# Patient Record
Sex: Female | Born: 1970 | Race: White | Hispanic: Yes | Marital: Married | Smoking: Current every day smoker
Health system: Southern US, Community
[De-identification: ages and names within clinical notes are randomized; demographics above are authoritative.]

## PROBLEM LIST (undated history)

## (undated) DIAGNOSIS — F32A Depression, unspecified: Secondary | ICD-10-CM

## (undated) DIAGNOSIS — F329 Major depressive disorder, single episode, unspecified: Secondary | ICD-10-CM

## (undated) DIAGNOSIS — G4730 Sleep apnea, unspecified: Secondary | ICD-10-CM

## (undated) DIAGNOSIS — K219 Gastro-esophageal reflux disease without esophagitis: Secondary | ICD-10-CM

## (undated) DIAGNOSIS — R569 Unspecified convulsions: Secondary | ICD-10-CM

## (undated) HISTORY — PX: ABDOMINAL HYSTERECTOMY: SHX81

## (undated) HISTORY — PX: CHOLECYSTECTOMY: SHX55

---

## 1989-12-25 HISTORY — PX: BRAIN TUMOR EXCISION: SHX577

## 2005-01-12 ENCOUNTER — Emergency Department: Payer: Self-pay | Admitting: Internal Medicine

## 2005-03-24 ENCOUNTER — Ambulatory Visit: Payer: Self-pay

## 2005-08-24 ENCOUNTER — Emergency Department: Payer: Self-pay | Admitting: Emergency Medicine

## 2005-10-17 ENCOUNTER — Emergency Department: Payer: Self-pay | Admitting: Emergency Medicine

## 2005-10-25 ENCOUNTER — Encounter: Payer: Self-pay | Admitting: Specialist

## 2006-02-08 ENCOUNTER — Emergency Department: Payer: Self-pay | Admitting: Emergency Medicine

## 2006-02-28 ENCOUNTER — Ambulatory Visit: Payer: Self-pay

## 2006-06-25 ENCOUNTER — Emergency Department: Payer: Self-pay | Admitting: Emergency Medicine

## 2006-07-26 ENCOUNTER — Ambulatory Visit: Payer: Self-pay

## 2006-11-28 ENCOUNTER — Ambulatory Visit: Payer: Self-pay

## 2006-12-10 ENCOUNTER — Emergency Department: Payer: Self-pay | Admitting: Emergency Medicine

## 2007-01-02 ENCOUNTER — Encounter: Payer: Self-pay | Admitting: Orthopedic Surgery

## 2007-01-21 ENCOUNTER — Ambulatory Visit: Payer: Self-pay | Admitting: Orthopedic Surgery

## 2007-01-25 ENCOUNTER — Encounter: Payer: Self-pay | Admitting: Orthopedic Surgery

## 2007-07-29 ENCOUNTER — Emergency Department: Payer: Self-pay | Admitting: Emergency Medicine

## 2007-07-29 ENCOUNTER — Other Ambulatory Visit: Payer: Self-pay

## 2007-08-02 ENCOUNTER — Ambulatory Visit: Payer: Self-pay | Admitting: Emergency Medicine

## 2007-10-18 ENCOUNTER — Ambulatory Visit: Payer: Self-pay

## 2007-10-24 ENCOUNTER — Inpatient Hospital Stay: Payer: Self-pay

## 2007-12-26 HISTORY — PX: CERVICAL FUSION: SHX112

## 2008-05-08 ENCOUNTER — Inpatient Hospital Stay (HOSPITAL_COMMUNITY): Admission: RE | Admit: 2008-05-08 | Discharge: 2008-05-08 | Payer: Self-pay | Admitting: Neurosurgery

## 2009-12-14 ENCOUNTER — Encounter: Admission: RE | Admit: 2009-12-14 | Discharge: 2009-12-14 | Payer: Self-pay | Admitting: Neurosurgery

## 2010-01-17 ENCOUNTER — Ambulatory Visit (HOSPITAL_COMMUNITY): Admission: RE | Admit: 2010-01-17 | Discharge: 2010-01-18 | Payer: Self-pay | Admitting: Neurosurgery

## 2010-06-01 ENCOUNTER — Encounter: Admission: RE | Admit: 2010-06-01 | Discharge: 2010-06-01 | Payer: Self-pay | Admitting: Neurosurgery

## 2010-08-03 ENCOUNTER — Emergency Department: Payer: Self-pay | Admitting: Unknown Physician Specialty

## 2010-08-13 ENCOUNTER — Encounter: Admission: RE | Admit: 2010-08-13 | Discharge: 2010-08-13 | Payer: Self-pay | Admitting: Neurosurgery

## 2010-09-20 ENCOUNTER — Ambulatory Visit: Payer: Self-pay

## 2011-03-12 LAB — CBC
Hemoglobin: 13.5 g/dL (ref 12.0–15.0)
MCV: 100.6 fL — ABNORMAL HIGH (ref 78.0–100.0)
RBC: 3.88 MIL/uL (ref 3.87–5.11)
WBC: 7.7 10*3/uL (ref 4.0–10.5)

## 2011-05-09 NOTE — Op Note (Signed)
Leah Carpenter, Leah Carpenter               ACCOUNT NO.:  [id number]   MEDICAL RECORD NO.:  [id number]          PATIENT TYPE:  OBV   LOCATION:  3534                         FACILITY:  [facility]   PHYSICIAN:  Donalee Citrin, M.D.        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   The patient failed all forms of conservative treatment with anti-  inflammatories, physical therapy, steroids, and electrical stimulation.  The patient's MRI scan showed central disk herniation at C4-C5, C5-C6  with kyphosis, and spinal cord compression.  The patient after failing  several treatments with MRI findings, she was recommended anterior  cervical diskectomy.  Risks and benefits of the operation were explained  to the patient.  She understood and agreed to proceed forward.   PROCEDURE IN DETAIL:  The patient was brought to the OR, was induced  with general anesthesia, placed in supine, neck was flexed in extension  and placed on 5 pounds of Holter traction.  The right side of her neck  was prepped and draped in the usual sterile fashion.  Preop x-ray  localized at the C6 vertebral body and a curvilinear incision was made  just superior to this, just off the midline to the anterior portion of  the sternocleidomastoid.  Superficially, the platysma was dissected out  and divided longitudinally.  The avascular end of the  sternocleidomastoid strap muscle was developed down to the prevertebral  fascia.  The prevertebral fascia was then dissected with Kitners.  Intraoperative x-ray confirmed localization of C5-C6 disk space.  Annulotomy was made with 15-blade scalpel, marked the disk space, and  then the longus colli reflected laterally at both disk space and the one  above it. A  self-retaining retractor was then placed.  Annulotomies were  extended to both levels.  Anterior osteophytes were bitten off the C4  and C5 vertebral bodies and both interspaces were scraped and drilled  down to the posterior annulus and posterior osteophyte complexes.  At  this point, the operative microscope was draped and brought into the  field.  Under microscopic illumination, __________  at C4-C5.  The  incisions were further drilled down, and then using 1-mm and 2-mm  Kerrison punch, the undersurface of the posterior annulus and posterior  longitudinal ligament was removed in piecemeal fashion.  There was a  large central disk herniation immediately expressed and this was removed  with a micropituitary and then the undersurface of the both endplates  were aggressively under bitten, decompressed at the central canal at the  level of the proximal C5 pedicle.  C5 foramen was then explored and  noted to be widely patent, and after the endplates were under bitten to  decompress the central canal, the wound was copiously irrigated and  endplates were scraped and Gelfoam was placed.  Attention was then taken  to C5-C6 in  a similar fashion and C5-C6 disk space was drilled down to  posterior annulus and posterior osteophyte complexes.  Again, central  disk herniation was appreciated at this level as well.  This was all  under bitten, decompressed at the central canal, both C6 pedicles were  identified, both C6 neural foramen were decompressed.  At the end of  diskectomy here again, there was no further stenosis of central canal or  either C6 nerve root.  Endplates were scraped and sized.  A 7-mm graft  was inserted at C5-C6 and 8-mm graft had been opened up for C4-C5;  however, after the placement of 7-mm graft to C5-C6, this closed down  that interspace and it would not accept the 8 anymore and it was not  felt to be in her best interest to over distract the disk space by  putting 8, and  so, the 8 was discarded and 7-mm graft was opened and  there was good apposition of the endplates and it was placed under  compression.  Then after the grafts were in place, a 37.5-mm Venture  plate was placed.  All 6 screws were drilled and then placed.  All  screws had excellent purchase.  Locking mechanism was engaged.  Wound  was  copiously irrigated.  Meticulous hemostasis was maintained.  Then, the  wound was closed in layers with interrupted Vicryl on the platysma and a  running 4-0 subcuticular.  Benzoin and Steri-Strips were applied.  The  patient went to the recovery room in stable condition.  At the end of  the case, instrument count was correct.           ______________________________  Donalee Citrin, M.D.     GC/MEDQ  D:  05/08/2008  T:  05/08/2008  Job:  [id number]

## 2012-04-29 ENCOUNTER — Emergency Department: Payer: Self-pay | Admitting: Emergency Medicine

## 2012-10-11 ENCOUNTER — Emergency Department (HOSPITAL_COMMUNITY)
Admission: EM | Admit: 2012-10-11 | Discharge: 2012-10-12 | Disposition: A | Discharge status: Home / Self Care | Payer: Federal, State, Local not specified - PPO | Attending: Emergency Medicine | Admitting: Emergency Medicine

## 2012-10-11 ENCOUNTER — Emergency Department (HOSPITAL_COMMUNITY): Payer: Federal, State, Local not specified - PPO

## 2012-10-11 ENCOUNTER — Encounter (HOSPITAL_COMMUNITY): Payer: Self-pay | Admitting: Neurology

## 2012-10-11 DIAGNOSIS — F329 Major depressive disorder, single episode, unspecified: Secondary | ICD-10-CM

## 2012-10-11 DIAGNOSIS — W19XXXA Unspecified fall, initial encounter: Secondary | ICD-10-CM | POA: Insufficient documentation

## 2012-10-11 DIAGNOSIS — N390 Urinary tract infection, site not specified: Secondary | ICD-10-CM | POA: Insufficient documentation

## 2012-10-11 DIAGNOSIS — G8929 Other chronic pain: Secondary | ICD-10-CM | POA: Insufficient documentation

## 2012-10-11 DIAGNOSIS — K219 Gastro-esophageal reflux disease without esophagitis: Secondary | ICD-10-CM | POA: Insufficient documentation

## 2012-10-11 DIAGNOSIS — S0003XA Contusion of scalp, initial encounter: Secondary | ICD-10-CM | POA: Insufficient documentation

## 2012-10-11 DIAGNOSIS — S1093XA Contusion of unspecified part of neck, initial encounter: Secondary | ICD-10-CM | POA: Insufficient documentation

## 2012-10-11 DIAGNOSIS — R4182 Altered mental status, unspecified: Secondary | ICD-10-CM

## 2012-10-11 DIAGNOSIS — Z79899 Other long term (current) drug therapy: Secondary | ICD-10-CM | POA: Insufficient documentation

## 2012-10-11 HISTORY — DX: Unspecified convulsions: R56.9

## 2012-10-11 HISTORY — DX: Gastro-esophageal reflux disease without esophagitis: K21.9

## 2012-10-11 HISTORY — DX: Major depressive disorder, single episode, unspecified: F32.9

## 2012-10-11 HISTORY — DX: Depression, unspecified: F32.A

## 2012-10-11 LAB — CBC WITH DIFFERENTIAL/PLATELET
Basophils Absolute: 0.1 10*3/uL (ref 0.0–0.1)
Basophils Relative: 1 % (ref 0–1)
Eosinophils Relative: 5 % (ref 0–5)
HCT: 37.9 % (ref 36.0–46.0)
MCHC: 34.8 g/dL (ref 30.0–36.0)
MCV: 91.1 fL (ref 78.0–100.0)
Monocytes Absolute: 0.7 10*3/uL (ref 0.1–1.0)
Platelets: 257 10*3/uL (ref 150–400)
RDW: 14 % (ref 11.5–15.5)

## 2012-10-11 LAB — RAPID URINE DRUG SCREEN, HOSP PERFORMED
Amphetamines: NOT DETECTED
Barbiturates: NOT DETECTED
Benzodiazepines: NOT DETECTED

## 2012-10-11 LAB — URINALYSIS, ROUTINE W REFLEX MICROSCOPIC
Bilirubin Urine: NEGATIVE
Ketones, ur: NEGATIVE mg/dL
Leukocytes, UA: NEGATIVE
Nitrite: POSITIVE — AB
Protein, ur: NEGATIVE mg/dL
Urobilinogen, UA: 1 mg/dL (ref 0.0–1.0)

## 2012-10-11 LAB — ACETAMINOPHEN LEVEL: Acetaminophen (Tylenol), Serum: <15 ug/mL (ref 10–30)

## 2012-10-11 LAB — COMPREHENSIVE METABOLIC PANEL
ALT: 17 U/L (ref 0–35)
BUN: 10 mg/dL (ref 6–23)
Calcium: 9.1 mg/dL (ref 8.4–10.5)
Chloride: 104 mEq/L (ref 96–112)
GFR calc Af Amer: >90 mL/min (ref >90)
Glucose, Bld: 109 mg/dL — ABNORMAL HIGH (ref 70–99)
Potassium: 3.7 mEq/L (ref 3.5–5.1)
Total Protein: 6.8 g/dL (ref 6.0–8.3)

## 2012-10-11 LAB — URINE MICROSCOPIC-ADD ON

## 2012-10-11 LAB — POCT PREGNANCY, URINE: Preg Test, Ur: NEGATIVE

## 2012-10-11 MED ORDER — NALOXONE HCL 0.4 MG/ML IJ SOLN
INTRAMUSCULAR | Status: AC
  Filled 2012-10-11: qty 1

## 2012-10-11 MED ORDER — SODIUM CHLORIDE 0.9 % IV BOLUS (SEPSIS)
1000.0000 mL | Freq: Once | INTRAVENOUS | Status: AC
  Administered 2012-10-11: 1000 mL via INTRAVENOUS

## 2012-10-11 MED ORDER — LAMOTRIGINE 100 MG PO TABS: 100.0000 mg | ORAL_TABLET | Freq: Two times a day (BID) | ORAL | Status: DC

## 2012-10-11 MED ORDER — METHOCARBAMOL 500 MG PO TABS
500.0000 mg | ORAL_TABLET | Freq: Three times a day (TID) | ORAL | Status: DC
  Administered 2012-10-12 (×3): 500 mg via ORAL
  Filled 2012-10-11 (×4): qty 1

## 2012-10-11 MED ORDER — SODIUM CHLORIDE 0.9 % IV SOLN
INTRAVENOUS | Status: DC
  Administered 2012-10-11: 21:00:00 via INTRAVENOUS

## 2012-10-11 MED ORDER — DULOXETINE HCL 60 MG PO CPEP
60.0000 mg | ORAL_CAPSULE | Freq: Every day | ORAL | Status: DC
  Filled 2012-10-11: qty 1

## 2012-10-11 MED ORDER — OXYCODONE-ACETAMINOPHEN 5-325 MG PO TABS
2.0000 | ORAL_TABLET | Freq: Once | ORAL | Status: AC
  Administered 2012-10-12: 2 via ORAL
  Filled 2012-10-11: qty 2

## 2012-10-11 MED ORDER — GABAPENTIN 300 MG PO CAPS
300.0000 mg | ORAL_CAPSULE | Freq: Three times a day (TID) | ORAL | Status: DC
  Administered 2012-10-12 (×2): 300 mg via ORAL
  Filled 2012-10-11 (×2): qty 1

## 2012-10-11 MED ORDER — PANTOPRAZOLE SODIUM 40 MG PO TBEC
40.0000 mg | DELAYED_RELEASE_TABLET | Freq: Every day | ORAL | Status: DC
  Administered 2012-10-12 (×2): 40 mg via ORAL
  Filled 2012-10-11 (×2): qty 1

## 2012-10-11 MED ORDER — NALOXONE HCL 0.4 MG/ML IJ SOLN
0.4000 mg | Freq: Once | INTRAMUSCULAR | Status: AC
  Administered 2012-10-11: 0.4 mg via INTRAVENOUS

## 2012-10-11 NOTE — ED Notes (Addendum)
Family at [facility] x2, pt began full body shaking, lasting about 20sec, pt then began to speak with crying voice in spanish, family understood what pt was saying, "speaking about her father who died several years ago and had a recent birthday", pt emotional crying, tears noted.  Pt still not responding to peripheral or central pain, not interactive, not following commands, airway intact, resps e/u, VSS, will continue to monitor. EDP updated.

## 2012-10-11 NOTE — ED Notes (Signed)
Pt reoriented to place & events

## 2012-10-11 NOTE — ED Notes (Signed)
EDP at [facility] speaking with family

## 2012-10-11 NOTE — ED Notes (Signed)
Per ems- Pt comes from home. Pt called her son at 11 he reports "she was talking out of her head" . Came over to check on her, found her in the bathroom on the floor. Blood on the floor. Hematoma to right forehead. CBG 96, HR 123/72, HR 85. Pt opening eyes. EDP at bedside.

## 2012-10-11 NOTE — ED Notes (Signed)
[employer] at bedside, pt continues to be combative, pt reoriented to place, Fonnie Jarvis remains at bedside

## 2012-10-11 NOTE — ED Notes (Signed)
Back from CT, no changes.  

## 2012-10-11 NOTE — ED Notes (Signed)
Family updated, VSS, no changes, remains on monitor, IVF infusing.

## 2012-10-11 NOTE — ED Notes (Signed)
Pt remains volatile, aggitated, beligerant, yelling. [employer] & EDP Dr. Fonnie Jarvis present at [facility] CN/AD aware.

## 2012-10-11 NOTE — ED Notes (Signed)
No changes. Pt reviewed with EDP. pending disposition.

## 2012-10-11 NOTE — ED Notes (Addendum)
While EDP inserting nasal trumpet, pt having flailing extremities, atypical seizure like activity. Pt is 100% on 2 L. Trumpet was not placed.

## 2012-10-11 NOTE — ED Notes (Signed)
Heard patient yelling and came into room.  RN Toniann Fail attempting to talk with patient.  Patient yelling at husband, stating "I need to get out of here!  Just let me get out of here!"  Patient pulling at lines and monitor wires.  Patient continues to yell at staff, trying to hit staff.  EDP Bednar informed, Charge RN Barbara Cower informed and at bedside.  Patient continuing to yell, try and pull lines, and hit staff.  Dr. Fonnie Jarvis attempting to talk to patient, orient her to location and situation.  Patient continues to yell and scream, "Get off of me!"

## 2012-10-11 NOTE — ED Notes (Signed)
Pt lethargic, MAEx4, not following commands, reacts to physical stimuli, coughing, VSS, SPo2 100%, resps 22, husband at [facility], family updated, cxr complete, lab at [facility], yellow metal necklace and charms removed given to husband. Pending CTs and urine.

## 2012-10-11 NOTE — ED Notes (Signed)
Pts husband yelled out for help, upon entering the room the pt was very combative, pt attempting to climb out of the bed, pt stating, "Get off of me. I do not know where I am at. Get away from me. Get off of me." pt swinging arms & kicking legs, Eustaquio Boyden, EMT assisted in keeping the pt from falling out of the bed, Barbara Cower, [employer] RN notified & @ bedside, Fonnie Jarvis, MD at bedside, husband at bedside

## 2012-10-11 NOTE — ED Notes (Signed)
No changes. Family x2 at [facility]. VSS. NAD, calm, resting, lethargic, non-interactive, non-verbal, sz precautions & monitor in place.

## 2012-10-11 NOTE — ED Notes (Addendum)
No sz activity at this time. sz pads in place, Buhler in place, to CT. Husband updated.

## 2012-10-11 NOTE — ED Notes (Signed)
Pt husband at bedside

## 2012-10-11 NOTE — ED Provider Notes (Signed)
Pt to CDU while waiting unassigned admission for AMS.    11:17 PM Pt has not been moved to CDU yet.  However on cursory exam, pt is alert and is verbally combative, yelling and requiring 2 person restrained.  Pt able to verbalized in a coherent manner, but forceful.  Vital sign otherwise stable, protecting her airway.    12:14 AM Pt has not been moved to CDU.  Pt will likely be admitted pending Dr. Jonn Shingles reevaluation.    i was not directly involved in the patient care during this visit.    Fayrene Helper, PA-C 10/12/12 0015
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
## 2012-10-11 NOTE — ED Notes (Signed)
EDP, RN x3, security and [employer] at [facility], pt alert & interactive, MAEx4.

## 2012-10-12 MED ORDER — CEPHALEXIN 500 MG PO CAPS: 500.0000 mg | ORAL_CAPSULE | Freq: Two times a day (BID) | ORAL | Status: DC

## 2012-10-12 MED ORDER — LAMOTRIGINE 200 MG PO TABS
200.0000 mg | ORAL_TABLET | Freq: Every day | ORAL | Status: DC
  Administered 2012-10-12: 200 mg via ORAL
  Filled 2012-10-12: qty 1

## 2012-10-12 MED ORDER — IBUPROFEN 400 MG PO TABS
600.0000 mg | ORAL_TABLET | Freq: Once | ORAL | Status: AC
  Administered 2012-10-12: 600 mg via ORAL
  Filled 2012-10-12 (×2): qty 1

## 2012-10-12 MED ORDER — LAMOTRIGINE 100 MG PO TABS
100.0000 mg | ORAL_TABLET | Freq: Every day | ORAL | Status: DC
  Administered 2012-10-12: 100 mg via ORAL
  Filled 2012-10-12 (×2): qty 1

## 2012-10-12 MED ORDER — HYDROXYZINE HCL 25 MG PO TABS: 25.0000 mg | ORAL_TABLET | Freq: Two times a day (BID) | ORAL | Status: DC

## 2012-10-12 MED ORDER — CEPHALEXIN 250 MG PO CAPS
500.0000 mg | ORAL_CAPSULE | Freq: Two times a day (BID) | ORAL | Status: DC
  Administered 2012-10-12: 500 mg via ORAL
  Filled 2012-10-12: qty 2

## 2012-10-12 NOTE — ED Notes (Signed)
Medications located. Pt discharged. Had no further questions.
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
## 2012-10-12 NOTE — ED Notes (Signed)
Sitter has returned 

## 2012-10-12 NOTE — ED Notes (Signed)
Calm, NAD, alert, interactive, family at [facility]. Tolerating PO meds.

## 2012-10-12 NOTE — ED Notes (Signed)
Security called for patient to be wonded.

## 2012-10-12 NOTE — ED Notes (Signed)
Relieved sitter

## 2012-10-12 NOTE — ED Notes (Signed)
Pt and family member states that medications were given to pharmacy tech last night and they did not receive medications back. Charge nurse aware and AD notified.

## 2012-10-12 NOTE — ED Notes (Signed)
Alert, NAD, calm, interactive, polite, cooperative, speaking kindly, skin W&D, resps e/u, speaking in clear complete sentences, denies nausea, tolerating PO fluids, mentions neck pain, ice pack applied, meds ordered, agreeable to plan of care at this point in time. EMT at [facility].

## 2012-10-12 NOTE — ED Notes (Signed)
Patient stating that she will not return to bed at this time.  States that she is in pain and needs to sit in a chair.  Patient informed of the need for her to be lying in the bed at this time for further evaluation.  Patient began to yell out, "I don't need to be here!  I can refuse!  I am of sane mind!  Let me leave!"   EDP Bednar and RN Toniann Fail came into the room at that time, with 2 [employer] officers.  Patient continuing to yell.  EDP Bednar talking with patient, states that she is able to sit in a chair at this time if she can remain cooperative.  Patient continuing to yell, stating that "It is my husband's fault that I am here!"  Patient remains sitting in the chair, talking with EDP Bednar regarding plan of care.

## 2012-10-12 NOTE — ED Notes (Signed)
Patient provided with another ice pack at this time.

## 2012-10-12 NOTE — ED Notes (Signed)
Telepsych consult in progress at the time.

## 2012-10-12 NOTE — ED Provider Notes (Addendum)
Pt now awake/alert, demanding to leave She does not understand why she is in the ER She asked to see the IVC paperwork and I showed her the paperwork that was filed with magistrate Initial physician that saw patient was concerned for her safety She has been here less than 24 hours She still needs to be seen by ACT I do not feel comfortable discharging patient until seen by ACT and may need inpatient admission BP 102/64  Pulse 70  Temp 97.3 F (36.3 C) (Oral)  Resp 18  SpO2 97%   Joya Gaskins, MD 10/12/12 1402  2:57 PM Pt seen by ACT She confirms to them that she is not suicidal Husband feels she is safe at home Patient feels comfortable going home as well with husband Seen by telepsych who recommends start vistaril and d/c home Pt has h/o ?seizures and seen by neurology as outpatient and she will f/u with this Pt is awake/alert, appropriate mental status at this time   Joya Gaskins, MD 10/12/12 1459

## 2012-10-12 NOTE — BHH Counselor (Signed)
Fax confirmation of IVC paperwork to [employer] Paperwork placed in pt's chart.

## 2012-10-12 NOTE — ED Notes (Signed)
Lights dimmed per request.  Patient calm and cooperative at this time.

## 2012-10-12 NOTE — ED Notes (Signed)
Patient upset, wanting to leave.  EDP Bednar at bedside talking with patient.  Patient stating that, "I am fine!  I don't want to be here!"  Patient yelling at husband, "This is all your fault that I am here!  You treat me like shit!  For 25 years I have cared for you, and this is how you repay me!"  Patient states, "I don't want to hurt myself, why would I want to do that?  I love my family.  It is all his fault (husband) that I am the way I am."

## 2012-10-12 NOTE — ED Notes (Signed)
Security in to wand patient at this time 

## 2012-10-12 NOTE — ED Notes (Signed)
Patient c/o feeling dizzy.  Patient assisted back into bed and RN Jon informed.  Seizure pads remain in place with suction setup at the head of the bed.  Patient provided with a pillow and warm blanket per request.

## 2012-10-12 NOTE — ED Notes (Signed)
Patient consumed 100% of happy meal. 
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
## 2012-10-12 NOTE — ED Provider Notes (Signed)
Medical screening examination/treatment/procedure(s) were conducted as a shared visit with non-physician practitioner(s) and myself.  I personally evaluated the patient during the encounter  Hurman Horn, MD 10/12/12 [phone]

## 2012-10-12 NOTE — ED Notes (Signed)
Patient calm and cooperative, sitting in chair in the room.  This tech remains at the bedside.  Patient requesting further information regarding the telepsych order and process.  Patient educated on the process at this time, and status of the order.  Patient states, "So it is just like when you call the MD and say you have the flu and need medicine.  You can be lying and how will they ever know?"  Patient educated on other resources available within the ED for behavioral health patients.  Patient states, "Still, how will they know your not lying?"

## 2012-10-12 NOTE — ED Notes (Signed)
Patient's daughter leaving at this time.  Amanda Leger [phone].

## 2012-10-12 NOTE — ED Notes (Addendum)
Pt resting quietly at the time. Sitter remains at bedside. Vital signs stable. Pt is calm and cooperative. Waiting for bed placement on POD C. Pt is conscious alert and oriented x4. Denies pain.

## 2012-10-12 NOTE — ED Notes (Addendum)
Pt agreeable, amiable, cooperative, meds given, moved to room D33. Family at [facility]. IVC in place, waiting on telepsych.

## 2012-10-12 NOTE — ED Notes (Signed)
Pt unable to move to POD C at the time. Nurse to call back when bed available.

## 2012-10-12 NOTE — ED Notes (Signed)
Patient provided with a sprite to drink and 2 ice packs per request.

## 2012-10-12 NOTE — ED Notes (Addendum)
Pt calmer/calm, sitting in chair, drinking water, speaking with female EMT at [facility], [employer] x2 present outside of room. Pt verbalized not wanting to deal with any males.

## 2012-10-12 NOTE — BH Assessment (Signed)
Assessment Note   Leah Carpenter is an 41 y.o. female. Pt was found unresponsive in bathroom by son and brought [facility] by EMS.  Pt became uncooperative last night, demanded discharge, and was placed on IVC by Dr Fonnie Jarvis.  Pt now calm, husband is also present.  Pt reports she is currently seeing a neurologist for siezures and may have had siezure last night.  Pt reports that she is having some problems with depression that have been going on for several years.  Pt has not sought treatment and has never had any mental health treatment.  Pt has some other medical problems, also reports stress due to marital issues and due to her son recently becoming a father and moving back home with his girlfriend and the child.  Pt denies SI/HI/AV.  Pt's husband does not report any concerns that pt is danger to self or others.  Pt does smoke marijauana, reports she uses one time per month, which causes marital tension.  Pt is asking to be discharged at this time.  Axis I: Depressive Disorder NOS Axis II: Deferred Axis III:  Past Medical History  Diagnosis Date  . Seizures   . Depression   . GERD (gastroesophageal reflux disease)    Axis IV: problems with primary support group Axis V: 51-60 moderate symptoms  Past Medical History:  Past Medical History  Diagnosis Date  . Seizures   . Depression   . GERD (gastroesophageal reflux disease)     No past surgical history on file.  Family History: No family history on file.  Social History:  does not have a smoking history on file. She does not have any smokeless tobacco history on file. Her alcohol and drug histories not on file.  Additional Social History:  Alcohol / Drug Use Pain Medications: Pt denies Prescriptions: Pt denies Over the Counter: Pt denies History of alcohol / drug use?: Yes Negative Consequences of Use: Personal relationships Substance #1 Name of Substance 1: marijuana 1 - Age of First Use: 15 1 - Amount (size/oz): 1 joint 1 -  Frequency: 1x month 1 - Duration: 4 years 1 - Last Use / Amount:  1 month ago, 1 joint  CIWA: CIWA-Ar BP: 102/64 mmHg Pulse Rate: 70  COWS:    Allergies: No Known Allergies  Home Medications:  (Not in a hospital admission)  OB/GYN Status:  No LMP recorded. Patient is not currently having periods (Reason: Other).  General Assessment Data Location of Assessment: [facility] ED ACT Assessment: Yes Living Arrangements: Spouse/significant other;Children;Other (Comment) (grandchild) Can pt return to current living arrangement?: Yes  Education Status Is patient currently in school?: No  Risk to self Suicidal Ideation: No Suicidal Intent: No Is patient at risk for suicide?: No Suicidal Plan?: No Access to Means: No What has been your use of drugs/alcohol within the last 12 months?: current marijuana use Previous Attempts/Gestures: No Intentional Self Injurious Behavior: None Family Suicide History: No Recent stressful life event(s): Conflict (Comment) (marital, problems with son and other relatives) Persecutory voices/beliefs?: No Depression: Yes Depression Symptoms: Despondent;Tearfulness;Fatigue;Guilt;Loss of interest in usual pleasures;Feeling angry/irritable Substance abuse history and/or treatment for substance abuse?: Yes Suicide prevention information given to non-admitted patients: Yes  Risk to Others Homicidal Ideation: No Thoughts of Harm to Others: No Current Homicidal Intent: No Current Homicidal Plan: No Access to Homicidal Means: No History of harm to others?: No Assessment of Violence: None Noted Does patient have access to weapons?: Yes (Comment) (husband's guns in home, locked) Criminal Charges  Pending?: No Does patient have a court date: No  Psychosis Hallucinations: None noted Delusions: None noted  Mental Status Report Appear/Hygiene: Other (Comment) (casual) Eye Contact: Good Motor Activity: Unremarkable Speech: Logical/coherent Level of  Consciousness: Alert Mood: Anxious Affect: Appropriate to circumstance Anxiety Level: Moderate Thought Processes: Coherent;Relevant Judgement: Unimpaired Orientation: Person;Place;Time;Situation Obsessive Compulsive Thoughts/Behaviors: None  Cognitive Functioning Concentration: Normal Memory: Recent Intact;Remote Intact IQ: Average Insight: Fair Impulse Control: Fair Appetite: Poor Weight Loss: 10  Weight Gain: 10  Sleep: Increased Total Hours of Sleep: 12  Vegetative Symptoms: None  ADLScreening [facility] Assessment [facility]) Patient's cognitive ability adequate to safely complete daily activities?: Yes Patient able to express need for assistance with ADLs?: Yes Independently performs ADLs?: Yes (appropriate for developmental age)  Abuse/Neglect [facility]) Physical Abuse: Denies Verbal Abuse: Denies Sexual Abuse: Denies  Prior Inpatient Therapy Prior Inpatient Therapy: No  Prior Outpatient Therapy Prior Outpatient Therapy: No  ADL Screening (condition at time of admission) Patient's cognitive ability adequate to safely complete daily activities?: Yes Patient able to express need for assistance with ADLs?: Yes Independently performs ADLs?: Yes (appropriate for developmental age) Weakness of Legs: None Weakness of Arms/Hands: None  Home Assistive Devices/Equipment Home Assistive Devices/Equipment: None    Abuse/Neglect Assessment (Assessment to be complete while patient is alone) Physical Abuse: Denies Verbal Abuse: Denies Sexual Abuse: Denies Exploitation of patient/patient's resources: Denies Self-Neglect: Denies Values / Beliefs Cultural Requests During Hospitalization: None Spiritual Requests During Hospitalization: None   Advance Directives (For Healthcare) Advance Directive: Patient does not have advance directive;Patient would not like information Nutrition Screen- [facility] Adult/[facility]/AP Patient's home diet: Regular  Additional Information 1:1 In Past 12 Months?:  No CIRT Risk: No Elopement Risk: No Does patient have medical clearance?: Yes     Disposition: Pt was placed under IVC last night when she became upset and asked to be discharged.  Pt was evaluated by telepsych who recommended discharge.  ACT discussed this pt with Dr Bebe Shaggy of [facility].  Pt does not meet criteria for commitment so the IVC will be rescinded and pt will be discharged.  Info given for follow up care with family Dr or psychiatrist for depression. Disposition Disposition of Patient: Other dispositions Other disposition(s): Information only  On Site Evaluation by:   Reviewed with Physician:     Lorri Frederick 10/12/2012 3:19 PM

## 2013-06-16 ENCOUNTER — Ambulatory Visit: Payer: Self-pay | Admitting: Diagnostic Neuroimaging

## 2014-01-29 ENCOUNTER — Observation Stay: Payer: Self-pay | Admitting: Surgery

## 2014-01-29 LAB — COMPREHENSIVE METABOLIC PANEL
ALBUMIN: 3.6 g/dL (ref 3.4–5.0)
ALT: 57 U/L (ref 12–78)
ANION GAP: 5 — AB (ref 7–16)
AST: 36 U/L (ref 15–37)
Alkaline Phosphatase: 160 U/L — ABNORMAL HIGH
BILIRUBIN TOTAL: 0.3 mg/dL (ref 0.2–1.0)
BUN: 8 mg/dL (ref 7–18)
CALCIUM: 9.3 mg/dL (ref 8.5–10.1)
CHLORIDE: 102 mmol/L (ref 98–107)
Co2: 28 mmol/L (ref 21–32)
Creatinine: 0.78 mg/dL (ref 0.60–1.30)
EGFR (Non-African Amer.): >60
GLUCOSE: 97 mg/dL (ref 65–99)
Osmolality: 268 (ref 275–301)
POTASSIUM: 4.2 mmol/L (ref 3.5–5.1)
SODIUM: 135 mmol/L — AB (ref 136–145)
Total Protein: 7.7 g/dL (ref 6.4–8.2)

## 2014-01-29 LAB — CBC WITH DIFFERENTIAL/PLATELET
BASOS PCT: 1.3 %
Basophil #: 0.1 10*3/uL (ref 0.0–0.1)
EOS PCT: 3.6 %
Eosinophil #: 0.3 10*3/uL (ref 0.0–0.7)
HCT: 42.2 % (ref 35.0–47.0)
HGB: 14.3 g/dL (ref 12.0–16.0)
LYMPHS ABS: 2.6 10*3/uL (ref 1.0–3.6)
LYMPHS PCT: 28.8 %
MCH: 31.4 pg (ref 26.0–34.0)
MCHC: 33.9 g/dL (ref 32.0–36.0)
MCV: 93 fL (ref 80–100)
Monocyte #: 0.6 x10 3/mm (ref 0.2–0.9)
Monocyte %: 6.3 %
NEUTROS PCT: 60 %
Neutrophil #: 5.5 10*3/uL (ref 1.4–6.5)
Platelet: 318 10*3/uL (ref 150–440)
RBC: 4.55 10*6/uL (ref 3.80–5.20)
RDW: 13.7 % (ref 11.5–14.5)
WBC: 9.2 10*3/uL (ref 3.6–11.0)

## 2014-01-29 LAB — URINALYSIS, COMPLETE
BILIRUBIN, UR: NEGATIVE
GLUCOSE, UR: NEGATIVE mg/dL (ref 0–75)
Ketone: NEGATIVE
LEUKOCYTE ESTERASE: NEGATIVE
NITRITE: NEGATIVE
Ph: 6 (ref 4.5–8.0)
Protein: 30
RBC,UR: 23 /HPF (ref 0–5)
Specific Gravity: 1.018 (ref 1.003–1.030)

## 2014-01-29 LAB — LIPASE, BLOOD: Lipase: 108 U/L (ref 73–393)

## 2014-01-30 LAB — HEPATIC FUNCTION PANEL A (ARMC)
ALBUMIN: 3 g/dL — AB (ref 3.4–5.0)
ALK PHOS: 189 U/L — AB
Bilirubin,Total: 0.4 mg/dL (ref 0.2–1.0)
SGOT(AST): 100 U/L — ABNORMAL HIGH (ref 15–37)
SGPT (ALT): 88 U/L — ABNORMAL HIGH (ref 12–78)
Total Protein: 6.8 g/dL (ref 6.4–8.2)

## 2014-01-30 LAB — CBC WITH DIFFERENTIAL/PLATELET
BASOS PCT: 1 %
Basophil #: 0.1 10*3/uL (ref 0.0–0.1)
EOS ABS: 0.5 10*3/uL (ref 0.0–0.7)
EOS PCT: 6.3 %
HCT: 38.2 % (ref 35.0–47.0)
HGB: 13.5 g/dL (ref 12.0–16.0)
LYMPHS ABS: 3.4 10*3/uL (ref 1.0–3.6)
Lymphocyte %: 44.9 %
MCH: 33 pg (ref 26.0–34.0)
MCHC: 35.4 g/dL (ref 32.0–36.0)
MCV: 93 fL (ref 80–100)
MONO ABS: 0.7 x10 3/mm (ref 0.2–0.9)
Monocyte %: 9.1 %
NEUTROS ABS: 2.9 10*3/uL (ref 1.4–6.5)
NEUTROS PCT: 38.7 %
PLATELETS: 271 10*3/uL (ref 150–440)
RBC: 4.09 10*6/uL (ref 3.80–5.20)
RDW: 13.9 % (ref 11.5–14.5)
WBC: 7.5 10*3/uL (ref 3.6–11.0)

## 2014-01-30 LAB — BASIC METABOLIC PANEL
ANION GAP: 3 — AB (ref 7–16)
BUN: 9 mg/dL (ref 7–18)
CALCIUM: 8.5 mg/dL (ref 8.5–10.1)
CO2: 30 mmol/L (ref 21–32)
CREATININE: 0.93 mg/dL (ref 0.60–1.30)
Chloride: 100 mmol/L (ref 98–107)
EGFR (African American): >60
EGFR (Non-African Amer.): >60
Glucose: 94 mg/dL (ref 65–99)
OSMOLALITY: 265 (ref 275–301)
POTASSIUM: 3.9 mmol/L (ref 3.5–5.1)
Sodium: 133 mmol/L — ABNORMAL LOW (ref 136–145)

## 2014-01-30 LAB — PROTIME-INR
INR: 0.9
PROTHROMBIN TIME: 12 s (ref 11.5–14.7)

## 2014-01-30 LAB — APTT: ACTIVATED PTT: 32.5 s (ref 23.6–35.9)

## 2014-01-31 LAB — CANCER ANTIGEN 19-9: CA 19 9: 6 U/mL (ref 0–35)

## 2014-01-31 LAB — CEA: CEA: 1.4 ng/mL (ref 0.0–4.7)

## 2014-02-02 LAB — PATHOLOGY REPORT

## 2014-06-08 ENCOUNTER — Inpatient Hospital Stay: Payer: Self-pay | Admitting: Internal Medicine

## 2014-06-08 LAB — COMPREHENSIVE METABOLIC PANEL
ALBUMIN: 3.6 g/dL (ref 3.4–5.0)
ANION GAP: 3 — AB (ref 7–16)
AST: 19 U/L (ref 15–37)
Alkaline Phosphatase: 89 U/L
BUN: 10 mg/dL (ref 7–18)
Bilirubin,Total: 0.4 mg/dL (ref 0.2–1.0)
CO2: 31 mmol/L (ref 21–32)
CREATININE: 0.88 mg/dL (ref 0.60–1.30)
Calcium, Total: 9.4 mg/dL (ref 8.5–10.1)
Chloride: 102 mmol/L (ref 98–107)
EGFR (African American): >60
EGFR (Non-African Amer.): >60
GLUCOSE: 100 mg/dL — AB (ref 65–99)
OSMOLALITY: 271 (ref 275–301)
POTASSIUM: 3.7 mmol/L (ref 3.5–5.1)
SGPT (ALT): 17 U/L (ref 12–78)
SODIUM: 136 mmol/L (ref 136–145)
Total Protein: 8.3 g/dL — ABNORMAL HIGH (ref 6.4–8.2)

## 2014-06-08 LAB — URINALYSIS, COMPLETE
Bacteria: NONE SEEN
Glucose,UR: NEGATIVE mg/dL (ref 0–75)
LEUKOCYTE ESTERASE: NEGATIVE
NITRITE: NEGATIVE
Ph: 5 (ref 4.5–8.0)
Protein: 100
RBC,UR: 97 /HPF (ref 0–5)
SPECIFIC GRAVITY: 1.036 (ref 1.003–1.030)
WBC UR: 2 /HPF (ref 0–5)

## 2014-06-08 LAB — CBC
HCT: 43 % (ref 35.0–47.0)
HGB: 14.2 g/dL (ref 12.0–16.0)
MCH: 31.1 pg (ref 26.0–34.0)
MCHC: 33.1 g/dL (ref 32.0–36.0)
MCV: 94 fL (ref 80–100)
PLATELETS: 331 10*3/uL (ref 150–440)
RBC: 4.57 10*6/uL (ref 3.80–5.20)
RDW: 13.7 % (ref 11.5–14.5)
WBC: 17.4 10*3/uL — AB (ref 3.6–11.0)

## 2014-06-10 LAB — CBC WITH DIFFERENTIAL/PLATELET
Basophil #: 0.1 10*3/uL (ref 0.0–0.1)
Basophil %: 0.6 %
EOS ABS: 0.3 10*3/uL (ref 0.0–0.7)
Eosinophil %: 2.7 %
HCT: 32.9 % — ABNORMAL LOW (ref 35.0–47.0)
HGB: 11.1 g/dL — ABNORMAL LOW (ref 12.0–16.0)
Lymphocyte #: 2.8 10*3/uL (ref 1.0–3.6)
Lymphocyte %: 23.8 %
MCH: 31.6 pg (ref 26.0–34.0)
MCHC: 33.8 g/dL (ref 32.0–36.0)
MCV: 93 fL (ref 80–100)
MONO ABS: 0.8 x10 3/mm (ref 0.2–0.9)
Monocyte %: 6.6 %
NEUTROS ABS: 7.8 10*3/uL — AB (ref 1.4–6.5)
NEUTROS PCT: 66.3 %
PLATELETS: 227 10*3/uL (ref 150–440)
RBC: 3.52 10*6/uL — AB (ref 3.80–5.20)
RDW: 13.4 % (ref 11.5–14.5)
WBC: 11.8 10*3/uL — AB (ref 3.6–11.0)

## 2014-06-10 LAB — BASIC METABOLIC PANEL
ANION GAP: 8 (ref 7–16)
BUN: 4 mg/dL — AB (ref 7–18)
CALCIUM: 8.2 mg/dL — AB (ref 8.5–10.1)
Chloride: 104 mmol/L (ref 98–107)
Co2: 27 mmol/L (ref 21–32)
Creatinine: 0.69 mg/dL (ref 0.60–1.30)
EGFR (African American): >60
Glucose: 78 mg/dL (ref 65–99)
Osmolality: 273 (ref 275–301)
POTASSIUM: 3.4 mmol/L — AB (ref 3.5–5.1)
Sodium: 139 mmol/L (ref 136–145)

## 2014-06-10 LAB — MAGNESIUM: Magnesium: 1.6 mg/dL — ABNORMAL LOW

## 2014-06-11 LAB — CBC WITH DIFFERENTIAL/PLATELET
BASOS PCT: 0.6 %
Basophil #: 0.1 10*3/uL (ref 0.0–0.1)
EOS PCT: 2.8 %
Eosinophil #: 0.3 10*3/uL (ref 0.0–0.7)
HCT: 32.4 % — ABNORMAL LOW (ref 35.0–47.0)
HGB: 11.1 g/dL — AB (ref 12.0–16.0)
Lymphocyte #: 2.6 10*3/uL (ref 1.0–3.6)
Lymphocyte %: 28.4 %
MCH: 31.7 pg (ref 26.0–34.0)
MCHC: 34.1 g/dL (ref 32.0–36.0)
MCV: 93 fL (ref 80–100)
MONO ABS: 0.6 x10 3/mm (ref 0.2–0.9)
MONOS PCT: 6.4 %
NEUTROS PCT: 61.8 %
Neutrophil #: 5.6 10*3/uL (ref 1.4–6.5)
Platelet: 231 10*3/uL (ref 150–440)
RBC: 3.49 10*6/uL — AB (ref 3.80–5.20)
RDW: 13.6 % (ref 11.5–14.5)
WBC: 9.1 10*3/uL (ref 3.6–11.0)

## 2014-06-13 LAB — CULTURE, BLOOD (SINGLE)

## 2014-06-24 ENCOUNTER — Ambulatory Visit: Payer: Self-pay | Admitting: Nurse Practitioner

## 2015-04-17 NOTE — H&P (Signed)
PATIENT NAME:  Leah Carpenter, Leah Carpenter MR#:  [id number] DATE OF BIRTH:  [birth year]  DATE OF ADMISSION:  01/29/2014  PRIMARY CARE PHYSICIAN: Dr. Yves DillNeelam Khan of [facility]  ADMITTING PHYSICIAN: Dr. Michela PitcherEly  CHIEF COMPLAINT: Abdominal pain.   BRIEF HISTORY: Ms. Leah Carpenter is a [age] year old woman seen in the Emergency Room with an 8-day history of significant right upper quadrant abdominal pain. She has never had any previous symptoms. The pain came on fairly insidiously over a week ago with primarily right upper quadrant and right back pain. Some of the pain will radiate to her right shoulder. She is mildly anorexic, has had several episodes of nausea, but is currently not nauseated. She has not vomited. She has increased pain when she tries to eat, and has been avoiding food for some time. She is having trouble voiding. She has not had a regular bowel movement in several days. She denies any fever. She has had no previous similar symptoms with her GI tract. She has had no previous workup of her GI tract. She denies a history of hepatitis, yellow jaundice, pancreatitis, peptic ulcer disease, previous diagnosis of gallbladder disease, or diverticulitis. The only previous surgery in her abdomen was hysteroscopy and eventual hysterectomy.   She has history of a brain tumor, felt to be a meningioma, 23 years ago. Surgery performed in the military. She has history of rotator cuff surgery. She has no cardiac disease, hypertension, or diabetes. She has no thyroid problems. She is a cigarette smoker. Does not drink alcohol regularly. She does not work outside her home. She was seen in the Emergency Room with her sister and brother-in-law.   ADMISSION MEDICATIONS: Include amitriptyline 2 tablets at bedtime, ibuprofen 800 mg 3 times daily p.r.n., Lamictal 100 mg 2 times a day, methocarbamol 750 mg 3 times a day, omeprazole 40 mg twice a day, Percocet 7.5/325 every 6 to 8 hours p.r.n.   ALLERGIES: SULFA, PHENERGAN, and  PENICILLIN.   PAST MEDICAL HISTORY:  She has history of spinal fusion and chronic back pain.   PHYSICAL EXAMINATION: GENERAL: She is lying comfortably in bed complaining of right upper quadrant pain.  VITAL SIGNS: Stable. Blood pressure of 103/68, heart rate of 92 and regular. She is afebrile.  HEENT: Exam reveals no scleral icterus. She has poor dentition. She has no facial deformities.  NECK: Supple, nontender, with a midline trachea. No adenopathy.  CHEST: Clear, with no adventitious sounds, and she has normal pulmonary excursion.  CARDIAC: Exam reveals no murmurs or gallops to my ear, and she seems to be in normal sinus rhythm.  ABDOMEN: Reveals some significant right upper quadrant tenderness. I cannot palpate her gallbladder. She has marked guarding, but no rebound. She has some CVA tenderness on the right. She has active bowel sounds, with no hernias noted.  EXTREMITIES:  She has full range of motion in the lower extremities. Good distal pulses. No deformities.  PSYCHIATRIC: Normal orientation, normal affect.   DATA:  Laboratory values were unremarkable, except for some elevated alkaline phosphatase. Ultrasound revealed some mild gallbladder wall thickening, several stones including one probable impacted cystic duct stone. She does not have any significant pericholecystic fluid. She did have a 15 to 20 mm mass in her gallbladder wall, suspicious for a polyp or neoplastic lesion. The Surgical service was consulted.   IMPRESSION: This woman likely has cholecystitis. In this situation, we would anticipate that her gallbladder wall lesion is likely a benign polyp, although the possibility of malignancy cannot be  ruled out completely. We would recommend admission to the hospital, placement on IV antibiotics, and rehydration. We will discuss surgical options with her in detail. I anticipate that she would be a good candidate for laparoscopic surgery, and if her pathology appears to be malignant,  then she would need more aggressive surgical intervention. The family is present during the interview. Will plan to get her admitted this evening and make a decision about surgery in the near future. I will discuss this case with the oncoming surgeon, Dr. Natale Lay.    ____________________________ Quentin Ore III, MD rle:mr D: 01/29/2014 18:46:00 ET T: 01/29/2014 18:59:44 ET JOB#: [id number]  cc: Carmie End, MD, <Dictator> Margaretann Loveless, MD  Quentin Ore MD ELECTRONICALLY SIGNED 02/02/2014 22:56

## 2015-04-17 NOTE — Discharge Summary (Signed)
PATIENT NAME:  Leah Carpenter, Leah Carpenter DATE OF BIRTH:  [birth year]  DATE OF ADMISSION:  06/08/2014 DATE OF DISCHARGE:  06/11/2014  ADMITTING PHYSICIAN:  Dr. Randol KernElgergawy  DISCHARGING PHYSICIAN:  Dr. Ellsworth Lennoxejan-Sie   DISCHARGE DIAGNOSIS: Acute diverticulitis.   IMAGING DATA:  CT scan of the abdomen and pelvis revealed diverticulitis without abscess.   PROCEDURE: None.   HOSPITAL COURSE: This lady was admitted through the Emergency Room with abdominal pain. Imaging in the ED revealed acute diverticulitis. The patient was admitted to a medical bed, initially placed on ciprofloxacin to which we added Flagyl. The patient'Leah Carpenter initial leukocytosis gradually returned to normal.  Her abdominal pain also improved significantly and she was able to tolerate a full regular diet without any discomfort. The patient'Leah Carpenter hospital stay was otherwise uncomplicated. She is being discharged to home in satisfactory condition.   DISCHARGE MEDICATIONS:  1. Flagyl 500 mg every 8 hours, 3 times a day.  2. Ciprofloxacin 500 mg twice a day.  Patient to resume other home medications. Please refer to medical reconciliation for that.   DISCHARGE INSTRUCTIONS:    DIET: Regular, low residue diet.   ACTIVITY: As tolerated.   FOLLOWUP: Follow up in 1-2 weeks with her primary care physician, Dr. Yves DillNeelam Khan.   DISCHARGE PROCESS TIME SPENT: 35 minutes.     ____________________________ Silas FloodSheikh A. Ellsworth Lennoxejan-Sie, MD sat:dd D: 06/28/2014 11:27:15 ET T: 06/28/2014 19:55:38 ET JOB#: [id number]  cc: Marland McalpineSheikh A. Ellsworth Lennoxejan-Sie, MD, <Dictator> Charlesetta GaribaldiSHEIKH A TEJAN-SIE MD ELECTRONICALLY SIGNED 07/03/2014 13:38

## 2015-04-17 NOTE — Discharge Summary (Signed)
PATIENT NAME:  Leah Carpenter, [name] MR#:  [id number] DATE OF BIRTH:  January 13, 1971  DATE OF ADMISSION:  01/29/2014 DATE OF DISCHARGE:  01/31/2014   PRINCIPLE DIAGNOSIS: Acute cholecystitis.   OTHER DIAGNOSES:  1. History of brain tumor (meningioma) 23 years ago.  2. Status post rotator cuff surgery.  3. Status post spinal fusion for chronic back pain.  4. Depression.   PRINCIPAL PROCEDURE PERFORMED DURING THIS ADMISSION: Laparoscopic cholecystectomy.   HOSPITAL COURSE: The patient was admitted to the hospital, given IV fluids and IV antibiotics and taken to the operating room, where she underwent the above-mentioned procedure for the above-mentioned diagnosis. On postoperative day 1, she was doing well, and her pain was controlled with Tylox, and she was complaining of a yeast infection from the antimicrobials. Therefore, she was discharged home with Tylox and 7-day course of 100 mg fluconazole by mouth. She was asked to make an appointment to see me in 1 to 2 weeks and to call in the interim for any recurrent right upper quadrant pain that was worse than what she was experiencing at the time of discharge, or jaundice symptoms or fever.   ____________________________ Claude MangesWilliam F. Maritza Goldsborough, MD wfm:lb D: 01/31/2014 08:15:59 ET T: 01/31/2014 11:42:03 ET JOB#: [id number]  cc: Claude MangesWilliam F. Ladanian Kelter, MD, <Dictator> Claude MangesWILLIAM F Lisset Ketchem MD ELECTRONICALLY SIGNED 02/13/2014 0:11

## 2015-04-17 NOTE — H&P (Signed)
PATIENT NAME:  Leah Carpenter, [place] E MR#:  [id number] DATE OF BIRTH:  04/14/1971  DATE OF ADMISSION:  06/08/2014  REFERRING PHYSICIAN: Dr. Governor Rooksebecca Lord    PRIMARY CARE PHYSICIAN: Dr. Yves DillNeelam Khan   CHIEF COMPLAINT: Abdominal pain.   HISTORY OF PRESENT ILLNESS: This is a 44 year old female who presents with complaints of abdominal pain, reports her pain has been going on over the last two weeks, but has been much worse over the last week, which prompted her to come to the ED today. In the ED, the patient was found to have leukocytosis at 17,000. She got CT abdomen and pelvis with contrast, which did show evidence of acute diverticulitis along the descending colon. The patient reports she had another episode of diverticulitis before, 10 years ago, where she was treated as an outpatient. The patient was recently hospitalized in February of this year due to acute cholecystitis, status post cholecystectomy. The patient was started on IV levofloxacin in the ED, and hospitalist requested to admit the patient for IV antibiotic administration. The patient reports she had nausea. She has multiple episodes of vomiting earlier during the day but none since. The patient denies any chest pain, any diarrhea, any constipation, any fever or chills.   PAST MEDICAL HISTORY: None.   PAST SURGICAL HISTORY:  1.  Cholecystectomy. 2.  Hysterectomy.    ALLERGIES: No known drug allergies.   HOME MEDICATIONS: 1.  Nucynta 50 mg oral 2 tablets every six hours.  2.  Neurontin 600 mg oral 3 times a day.  3.  Skelaxin 800 mg oral 3 times a day.  4.  Omeprazole 40 mg 2 times a day.    SOCIAL HISTORY: The patient lives at home. She smokes on a daily basis. No alcohol. No illicit drug use.   FAMILY HISTORY: Denies any family history of coronary artery disease at young age.   REVIEW OF SYSTEMS:  CONSTITUTIONAL:  Denies fever, chills. Reports fatigue, weakness.  EYES: Denies blurry vision, double vision, inflammation.  ENT:  Denies tinnitus, ear pain, hearing loss. RESPIRATORY: Denies cough, wheezing, hemoptysis.  CARDIOVASCULAR: Denies chest pain, orthopnea, edema.  GASTROINTESTINAL: Reports nausea, mild vomiting and abdominal pain. Denies diarrhea, hematemesis, constipation.  GENITOURINARY: Denies dysuria, hematuria, renal colic.  ENDOCRINE: Denies polyuria, polydipsia, heat or cold intolerance.  HEMATOLOGY: Denies anemia, easy bruising, bleeding diathesis.  INTEGUMENT: Denies acne, rash or skin lesion.  MUSCULOSKELETAL: Denies any swelling, gout, cramps.  NEUROLOGIC: Denies CVA, transient ischemic attack, tremors, ataxia.  PSYCHIATRIC: Denies anxiety, insomnia, or depression.   PHYSICAL EXAMINATION: VITAL SIGNS: Temperature 98.3, pulse 83, respiratory rate 20, blood pressure 145/91, saturating 97% on room air.  GENERAL: Well-nourished female, looks comfortable, in no apparent distress.  HEENT: Head atraumatic, normocephalic. Pupils equal, reactive to light. Pink conjunctivae. Anicteric sclerae. Moist oral mucosa.  NECK: Supple. No thyromegaly. No JVD.  CHEST: Good air entry bilaterally. No wheezing, rales, rhonchi.  CARDIOVASCULAR: S1, S2 heard. No rubs, murmurs, gallops.  ABDOMEN: Mild tenderness to palpation in the left lower quadrant. Otherwise, no rebound, no guarding. Bowel sounds present in four quadrants.  EXTREMITIES: No edema. No clubbing. No cyanosis. Pedal and radial pulses felt bilaterally.  PSYCHIATRIC: Appropriate affect. Awake, alert x3. Intact judgment and insight.  NEUROLOGIC: Cranial nerves grossly intact. Motor 5/5. No focal deficit.  MUSCULOSKELETAL: No joint effusion or erythema.   PERTINENT LABORATORY DATA: Glucose 100, BUN 10, creatinine 0.88, sodium 136, potassium 3.7, chloride 102. White blood cells 17.4, hemoglobin 14.2, hematocrit 43, platelets 331. Urinalysis negative  for leukocyte esterase and nitrite. CT abdomen and pelvis showing acute diverticulitis along the descending colon.    ASSESSMENT AND PLAN: 1.  Acute diverticulitis, as evident on CT abdomen and pelvis, with patient complaints of left lower quadrant abdominal pain. The patient will be started on IV levofloxacin. We will keep her on a clear liquid diet. Will keep him on as-needed nausea and pain medicine.  2.  Tobacco abuse. The patient will be started on nicotine patch.  3.  Deep vein thrombosis prophylaxis. Subcutaneous heparin.   CODE STATUS: Full code.   TOTAL TIME SPENT ON ADMISSION AND PATIENT CARE: 45 minutes.    ____________________________ Starleen Arms, MD dse:cg D: 06/09/2014 01:16:56 ET T: 06/09/2014 02:43:44 ET JOB#: [id number]  cc: Starleen Arms, MD, <Dictator> DAWOOD Teena Irani MD ELECTRONICALLY SIGNED 06/09/2014 6:45

## 2015-04-17 NOTE — Op Note (Signed)
PATIENT NAME:  Leah Carpenter, [name] MR#:  [id number] DATE OF BIRTH:  January 27, 1971  DATE OF PROCEDURE:  01/30/2014  OPERATION PERFORMED: Laparoscopic cholecystectomy.   PREOPERATIVE DIAGNOSIS: Acute cholecystitis and gallbladder mass.   POSTOPERATIVE DIAGNOSIS: Acute cholecystitis (no gallbladder mass).   SURGEON: Claude MangesWilliam F Goerge Mohr, M.D.   ANESTHESIA: General.   PROCEDURE IN DETAIL: The patient was placed supine on the operating room table and prepped and draped in the usual sterile fashion. A 15 mmHg CO2 pneumoperitoneum was created via an infraumbilical Veress needle and this was replaced with a 5 mm trocar and a 30 degree angled laparoscope. Remaining trocars were placed under direct visualization. The gallbladder was decompressed as it was very tense and thick-walled. The very tip of the fundus was then grasped and retracted superiorly and adhesions to the visceral surface of the gallbladder were taken down with electrocautery until the infundibulum was reached, and this was retracted laterally opening up the triangle of Calot. There was significant amount of inflammatory tissue within the triangle of Calot but careful dissection revealed a cystic duct which was doubly clipped and divided and a cystic artery which was doubly clipped and divided. A posterior cystic artery was subsequently identified and was triply clipped and divided and the gallbladder was removed from the liver bed with the electrocautery, taking care not to grab the middle of the visceral surface of the gallbladder as I thought that was where the intraluminal gallbladder mass was located. In doing so, about 2 sq cm of the very tip of the fundus of the gallbladder on the liver bed was left intact on the gallbladder fossa. In other words, the gallbladder was entered and a small portion of the posterior wall was left on the liver. The gallbladder was replaced into an Endo Catch bag and extracted from the abdomen via the epigastric port.  This port site fascia required significant enlargement due to the size of the stones and the thickness of the gallbladder wall and furthermore, I did not want to destroy the visceral surface of the gallbladder where the supposedly mass was located. After I removed it, I looked at the gallbladder and inspected it ex vivo and I cut down through the liver bed toward the infundibulum further and looked at the entire mucosa and there was in fact, no mass present of any kind. There were two large gallstones, however. Therefore, I felt comfortable leaving the small portion of the back wall of the gallbladder on the liver bed as I had done and I closed the fascial defect extracorporeally with a running 0 PDS suture and then reinsufflated the abdomen and reinspected the area. First, I put an additional single 0 Vicryl stitch in the epigastric fascia as it was leaking a little bit of air and then I irrigated the right upper quadrant with copious amounts of warm normal saline and this was all suctioned clear. The area of remaining gallbladder was hemostatic as was the liver bed and the areas of dissection and there was no evidence of bile staining. The clips were secure on their individual structures. Therefore, I desufflated and decannulated the abdomen and closed all 4 skin sites with subcuticular 5-0 Monocryl and suture strips. The patient tolerated the procedure well and there were no complications.   ____________________________ Claude MangesWilliam F. Fernando Stoiber, MD wfm:dp D: 01/30/2014 14:04:39 ET T: 01/30/2014 14:51:23 ET JOB#: [id number]  cc: Claude MangesWilliam F. Carrell Rahmani, MD, <Dictator> Claude MangesWILLIAM F Aliena Ghrist MD ELECTRONICALLY SIGNED 01/31/2014 8:25

## 2017-06-03 ENCOUNTER — Emergency Department: Payer: No Typology Code available for payment source

## 2017-06-03 ENCOUNTER — Encounter: Payer: Self-pay | Admitting: Emergency Medicine

## 2017-06-03 ENCOUNTER — Emergency Department
Admission: EM | Admit: 2017-06-03 | Discharge: 2017-06-03 | Disposition: A | Discharge status: Home / Self Care | Payer: No Typology Code available for payment source | Attending: Emergency Medicine | Admitting: Emergency Medicine

## 2017-06-03 DIAGNOSIS — Z9049 Acquired absence of other specified parts of digestive tract: Secondary | ICD-10-CM | POA: Diagnosis not present

## 2017-06-03 DIAGNOSIS — F17290 Nicotine dependence, other tobacco product, uncomplicated: Secondary | ICD-10-CM | POA: Diagnosis not present

## 2017-06-03 DIAGNOSIS — H9313 Tinnitus, bilateral: Secondary | ICD-10-CM | POA: Diagnosis not present

## 2017-06-03 DIAGNOSIS — Z7982 Long term (current) use of aspirin: Secondary | ICD-10-CM | POA: Diagnosis not present

## 2017-06-03 DIAGNOSIS — M549 Dorsalgia, unspecified: Secondary | ICD-10-CM | POA: Diagnosis not present

## 2017-06-03 DIAGNOSIS — Y9389 Activity, other specified: Secondary | ICD-10-CM | POA: Insufficient documentation

## 2017-06-03 DIAGNOSIS — Y92410 Unspecified street and highway as the place of occurrence of the external cause: Secondary | ICD-10-CM | POA: Diagnosis not present

## 2017-06-03 DIAGNOSIS — T700XXA Otitic barotrauma, initial encounter: Secondary | ICD-10-CM | POA: Insufficient documentation

## 2017-06-03 DIAGNOSIS — Z79899 Other long term (current) drug therapy: Secondary | ICD-10-CM | POA: Diagnosis not present

## 2017-06-03 DIAGNOSIS — Y998 Other external cause status: Secondary | ICD-10-CM | POA: Diagnosis not present

## 2017-06-03 DIAGNOSIS — M542 Cervicalgia: Secondary | ICD-10-CM | POA: Diagnosis present

## 2017-06-03 MED ORDER — CYCLOBENZAPRINE HCL 5 MG PO TABS: 5.0000 mg | ORAL_TABLET | Freq: Three times a day (TID) | ORAL | 0 refills | Status: AC | PRN

## 2017-06-03 MED ORDER — ORPHENADRINE CITRATE 30 MG/ML IJ SOLN
60.0000 mg | Freq: Two times a day (BID) | INTRAMUSCULAR | Status: DC
  Administered 2017-06-03: 60 mg via INTRAMUSCULAR
  Filled 2017-06-03: qty 2

## 2017-06-03 MED ORDER — MELOXICAM 7.5 MG PO TABS: 7.5000 mg | ORAL_TABLET | Freq: Every day | ORAL | 1 refills | Status: AC
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
## 2018-06-19 ENCOUNTER — Other Ambulatory Visit: Payer: Self-pay | Admitting: Urology

## 2018-06-19 DIAGNOSIS — R310 Gross hematuria: Secondary | ICD-10-CM

## 2018-06-25 ENCOUNTER — Ambulatory Visit
Admission: RE | Admit: 2018-06-25 | Discharge: 2018-06-25 | Disposition: A | Discharge status: Home / Self Care | Payer: Federal, State, Local not specified - PPO | Source: Ambulatory Visit | Attending: Urology | Admitting: Urology

## 2018-06-25 DIAGNOSIS — R310 Gross hematuria: Secondary | ICD-10-CM | POA: Diagnosis present

## 2018-06-25 DIAGNOSIS — K5730 Diverticulosis of large intestine without perforation or abscess without bleeding: Secondary | ICD-10-CM | POA: Insufficient documentation

## 2018-06-25 MED ORDER — IOPAMIDOL (ISOVUE-300) INJECTION 61%
125.0000 mL | Freq: Once | INTRAVENOUS | Status: AC | PRN
  Administered 2018-06-25: 125 mL via INTRAVENOUS

## 2018-10-29 ENCOUNTER — Encounter: Payer: Self-pay | Admitting: General Surgery

## 2018-10-29 ENCOUNTER — Other Ambulatory Visit: Payer: Self-pay

## 2018-10-29 ENCOUNTER — Ambulatory Visit: Payer: Federal, State, Local not specified - PPO | Admitting: General Surgery

## 2018-10-29 VITALS — BP 115/79 | HR 66 | Temp 97.2°F | Ht 63.0 in | Wt 159.0 lb

## 2018-10-29 DIAGNOSIS — K5732 Diverticulitis of large intestine without perforation or abscess without bleeding: Secondary | ICD-10-CM

## 2018-10-29 DIAGNOSIS — R1084 Generalized abdominal pain: Secondary | ICD-10-CM | POA: Diagnosis not present

## 2018-10-29 DIAGNOSIS — K219 Gastro-esophageal reflux disease without esophagitis: Secondary | ICD-10-CM | POA: Diagnosis not present

## 2018-10-29 NOTE — Patient Instructions (Addendum)
Patient to have a ct colonoscopy and upper endoscopy. The patient is aware to call back for any questions or concerns.  The patient is scheduled for an Upper Endoscopy on 11/06/18 at [facility]. She is aware to call the day prior for her arrival time. The patient is aware of date and instructions.  Leah Carpenter will contact the patient to schedule her virtual colonoscopy.

## 2018-10-29 NOTE — Progress Notes (Signed)
Patient ID: Leah Carpenter, female   DOB: 01/07/1971, 47 y.o.   MRN: [id number]  Chief Complaint  Patient presents with  . Other    HPI Leah Carpenter is a 47 y.o. female here today for a evaluation of a diverticulitis and chronic diarrhea .Lots of reflux. She reports that she has 5-6 stools per day for the last several months, marked change from her baseline of 2-3 stools per day.  She denies blood or mucus in the bowel movement.   She had a colonoscopy done on 08/29/2018 by Dr. Mechele Collin that was incomplete due to a suspected stricture at the proximal sigmoid colon.   Patient had a CT scan done on 06/25/2018 for hematuria.   Patient states she has lost 20 pounds over the last years.   The patient reports a marked decrease in cough and dyspnea since discontinuing smoking, substituting e-cigarettes.  She may still be experiencing reflux due to the nicotine depression of lower esophageal sphincter pressure.   HPI  Past Medical History:  Diagnosis Date  . Depression   . GERD (gastroesophageal reflux disease)   . Seizures (HCC)     Past Surgical History:  Procedure Laterality Date  . ABDOMINAL HYSTERECTOMY Left    one ovary remaining  . BRAIN TUMOR EXCISION  1991  . CERVICAL FUSION  2009  . CHOLECYSTECTOMY      Family History  Problem Relation Age of Onset  . Bone cancer Sister   . Uterine cancer Paternal Grandmother   . Stomach cancer Cousin        5 cousin  . Breast cancer Maternal Aunt        3 aunt  . Ovarian cancer Sister     Social History Social History   Tobacco Use  . Smoking status: Current Every Day Smoker    Types: E-cigarettes  . Smokeless tobacco: Never Used  Substance Use Topics  . Alcohol use: No  . Drug use: No    No Known Allergies  Current Outpatient Medications  Medication Sig Dispense Refill  . omeprazole (PRILOSEC) 40 MG capsule Take 40 mg by mouth 2 (two) times daily.    . ranitidine (ZANTAC) 150 MG tablet Take 150 mg by mouth 2 (two)  times daily.     No current facility-administered medications for this visit.     Review of Systems Review of Systems  Constitutional: Negative.   Respiratory: Negative.   Cardiovascular: Negative.   Gastrointestinal: Positive for diarrhea.    Blood pressure 115/79, pulse 66, temperature (!) 97.2 F (36.2 C), temperature source Skin, height 5\' 3"  (1.6 m), weight 159 lb (72.1 kg).  Physical Exam Physical Exam  Constitutional: She is oriented to person, place, and time. She appears well-developed and well-nourished.  Eyes: Conjunctivae are normal. No scleral icterus.  Neck: Normal range of motion.  Cardiovascular: Normal rate, regular rhythm and normal heart sounds.  Pulmonary/Chest: Effort normal and breath sounds normal.  Abdominal: Soft. Bowel sounds are normal. There is tenderness ( left lower quadrant).  Lymphadenopathy:    She has no cervical adenopathy.  Neurological: She is alert and oriented to person, place, and time.  Skin: Skin is warm and dry.    Data Reviewed Colonoscopy dated August 29, 2018 completed by Lynnae Prude, MD at [facility] was reviewed.  A benign appearing, in [place], severe stenosis was found in the proximal sigmoid colon and was unable to be traversed.  Internal hemorrhoids.  Otherwise unremarkable exam.  CT scan of the  abdomen and pelvis obtained for evaluation of microhematuria of June 25, 2018 was reviewed.  No indication for hematuria source identified.  "Age advanced diffuse diverticulosis".  This extended well into the right colon with the most extensive disease in the rectosigmoid.  No clear evidence of stenosis appreciated on this study completed without oral contrast.  CT scan of the abdomen and pelvis dated June 08, 2014 was reviewed.  Acute diverticulitis noted in the descending colon with soft tissue inflammation, free fluid and associated colonic wall thickening.  No evidence of perforation.  Assessment    Diverticulosis with at least  one CT confirmed episode of diverticulitis in 2015.  Incomplete colonoscopy.  Significant history for upper abdominal reflux.    Plan  Prior to consideration of elective colectomy I recommended 1) an upper endoscopy to be completed by Dr. Mechele Collin or myself to assess her reflux symptoms, especially in light of her past history of smoking and 2) a CT colonoscopy to evaluate the proximal colon for possible polyps.  After the studies are completed we will review options for management of the diverticulosis.  The patient may have only retrograde obstruction and no antegrade obstruction, although her history of diarrhea with water slightly less frequent and pudding-like stools may represent partial obstruction.  The patient reports weight loss over the last couple of years without particular any change in her diet.  The July 2019 CT did not show any source for unexplained weight loss.  Patient to have a ct colonoscopy in [place] and upper endoscopy at [facility].  The patient is aware to call back for any questions or concerns.   HPI, Physical Exam, Assessment and Plan have been scribed under the direction and in the presence of Donnalee Curry, MD.  [name], CMA  I have completed the exam and reviewed the above documentation for accuracy and completeness.  I agree with the above.  Museum/gallery conservator has been used and any errors in dictation or transcription are unintentional.  Donnalee Curry, M.D., F.A.C.S.  The patient is scheduled for an Upper Endoscopy on 11/06/18 at [facility]. She is aware to call the day prior for her arrival time. The patient is aware of date and instructions. Center imaging will contact the patient to schedule her virtual colonoscopy. Documented by Caryl-Lyn Louanna Raw LPN  Merrily Pew [name] 10/30/2018, 3:58 PM

## 2018-10-30 DIAGNOSIS — K219 Gastro-esophageal reflux disease without esophagitis: Secondary | ICD-10-CM | POA: Insufficient documentation

## 2018-10-30 DIAGNOSIS — R1084 Generalized abdominal pain: Secondary | ICD-10-CM | POA: Insufficient documentation

## 2018-10-30 DIAGNOSIS — K5732 Diverticulitis of large intestine without perforation or abscess without bleeding: Secondary | ICD-10-CM | POA: Insufficient documentation

## 2018-11-06 ENCOUNTER — Ambulatory Visit: Payer: Federal, State, Local not specified - PPO | Admitting: Anesthesiology

## 2018-11-06 ENCOUNTER — Ambulatory Visit
Admission: RE | Admit: 2018-11-06 | Discharge: 2018-11-06 | Disposition: A | Discharge status: Home / Self Care | Payer: Federal, State, Local not specified - PPO | Source: Ambulatory Visit | Attending: General Surgery | Admitting: General Surgery

## 2018-11-06 ENCOUNTER — Encounter
Admission: RE | Disposition: A | Discharge status: Home / Self Care | Payer: Self-pay | Source: Ambulatory Visit | Attending: General Surgery

## 2018-11-06 ENCOUNTER — Encounter: Payer: Self-pay | Admitting: *Deleted

## 2018-11-06 DIAGNOSIS — K2950 Unspecified chronic gastritis without bleeding: Secondary | ICD-10-CM | POA: Insufficient documentation

## 2018-11-06 DIAGNOSIS — K317 Polyp of stomach and duodenum: Secondary | ICD-10-CM | POA: Insufficient documentation

## 2018-11-06 DIAGNOSIS — K3189 Other diseases of stomach and duodenum: Secondary | ICD-10-CM | POA: Insufficient documentation

## 2018-11-06 DIAGNOSIS — F17290 Nicotine dependence, other tobacco product, uncomplicated: Secondary | ICD-10-CM | POA: Diagnosis not present

## 2018-11-06 DIAGNOSIS — K219 Gastro-esophageal reflux disease without esophagitis: Secondary | ICD-10-CM

## 2018-11-06 DIAGNOSIS — K449 Diaphragmatic hernia without obstruction or gangrene: Secondary | ICD-10-CM | POA: Insufficient documentation

## 2018-11-06 DIAGNOSIS — K529 Noninfective gastroenteritis and colitis, unspecified: Secondary | ICD-10-CM | POA: Diagnosis not present

## 2018-11-06 DIAGNOSIS — K210 Gastro-esophageal reflux disease with esophagitis: Secondary | ICD-10-CM | POA: Diagnosis not present

## 2018-11-06 HISTORY — PX: ESOPHAGOGASTRODUODENOSCOPY (EGD) WITH PROPOFOL: SHX5813

## 2018-11-06 SURGERY — ESOPHAGOGASTRODUODENOSCOPY (EGD) WITH PROPOFOL
Anesthesia: General

## 2018-11-06 MED ORDER — MIDAZOLAM HCL 2 MG/2ML IJ SOLN
INTRAMUSCULAR | Status: AC
  Filled 2018-11-06: qty 2

## 2018-11-06 MED ORDER — PROPOFOL 10 MG/ML IV BOLUS
INTRAVENOUS | Status: DC | PRN
  Administered 2018-11-06: 80 mg via INTRAVENOUS
  Administered 2018-11-06: 40 mg via INTRAVENOUS

## 2018-11-06 MED ORDER — LIDOCAINE HCL (PF) 2 % IJ SOLN
INTRAMUSCULAR | Status: AC
  Filled 2018-11-06: qty 10

## 2018-11-06 MED ORDER — LIDOCAINE HCL (CARDIAC) PF 100 MG/5ML IV SOSY
PREFILLED_SYRINGE | INTRAVENOUS | Status: DC | PRN
  Administered 2018-11-06 (×2): 40 mg via INTRAVENOUS

## 2018-11-06 MED ORDER — SODIUM CHLORIDE 0.9 % IV SOLN
INTRAVENOUS | Status: DC
  Administered 2018-11-06: 08:00:00 via INTRAVENOUS

## 2018-11-06 MED ORDER — GLYCOPYRROLATE 0.2 MG/ML IJ SOLN
INTRAMUSCULAR | Status: DC | PRN
  Administered 2018-11-06: 0.2 mg via INTRAVENOUS

## 2018-11-06 MED ORDER — MIDAZOLAM HCL 2 MG/2ML IJ SOLN
INTRAMUSCULAR | Status: DC | PRN
  Administered 2018-11-06: 2 mg via INTRAVENOUS

## 2018-11-06 MED ORDER — PROPOFOL 500 MG/50ML IV EMUL
INTRAVENOUS | Status: DC | PRN
  Administered 2018-11-06: 150 ug/kg/min via INTRAVENOUS

## 2018-11-06 MED ORDER — PROPOFOL 500 MG/50ML IV EMUL
INTRAVENOUS | Status: AC
  Filled 2018-11-06: qty 50

## 2018-11-06 NOTE — Anesthesia Post-op Follow-up Note (Signed)
Anesthesia QCDR form completed.        

## 2018-11-06 NOTE — Anesthesia Procedure Notes (Addendum)
Date/Time: 11/06/2018 9:08 AM Performed by: Stormy Fabianurtis, Matraca Hunkins, CRNA Pre-anesthesia Checklist: Patient identified, Emergency Drugs available, Suction available and Patient being monitored Patient Re-evaluated:Patient Re-evaluated prior to induction Oxygen Delivery Method: Nasal cannula Induction Type: IV induction Dental Injury: Teeth and Oropharynx as per pre-operative assessment  Comments: Nasal cannula with etCO2 monitoring

## 2018-11-06 NOTE — H&P (Signed)
No change in clinical history or exam. Hx symptomatic reflux on good medical regimen.  For EGD.

## 2018-11-06 NOTE — Transfer of Care (Signed)
Immediate Anesthesia Transfer of Care Note  Patient: Leah Carpenter  Procedure(s) Performed: Procedure(s): ESOPHAGOGASTRODUODENOSCOPY (EGD) WITH PROPOFOL (N/A)  Patient Location: PACU and Endoscopy Unit  Anesthesia Type:General  Level of Consciousness: sedated  Airway & Oxygen Therapy: Patient Spontanous Breathing and Patient connected to nasal cannula oxygen  Post-op Assessment: Report given to RN and Post -op Vital signs reviewed and stable  Post vital signs: Reviewed and stable  Last Vitals:  Vitals:   11/06/18 0920 11/06/18 0929  BP:  96/61  Pulse:  74  Resp:  18  Temp: (!) (P) 36.2 C   SpO2:  98%    Complications: No apparent anesthesia complications

## 2018-11-06 NOTE — Op Note (Signed)
[facility] Gastroenterology Patient Name: Leah Carpenter Procedure Date: 11/06/2018 9:00 AM MRN: [id number] Account #: [id number] Date of Birth: 18-Mar-1971 Admit Type: Outpatient Age: 47 Room: [facility] ENDO ROOM 1 Gender: Female Note Status: Finalized Procedure:            Upper GI endoscopy Indications:          Indigestion Providers:            Earline Mayotte, MD Medicines:            Monitored Anesthesia Care Complications:        No immediate complications. Procedure:            Pre-Anesthesia Assessment:                       - Prior to the procedure, a History and Physical was                        performed, and patient medications, allergies and                        sensitivities were reviewed. The patient's tolerance of                        previous anesthesia was reviewed.                       - The risks and benefits of the procedure and the                        sedation options and risks were discussed with the                        patient. All questions were answered and informed                        consent was obtained.                       After obtaining informed consent, the endoscope was                        passed under direct vision. Throughout the procedure,                        the patient's blood pressure, pulse, and oxygen                        saturations were monitored continuously. The Endoscope                        was introduced through the mouth, and advanced to the                        second part of duodenum. The upper GI endoscopy was                        accomplished without difficulty. The patient tolerated                        the procedure well. Findings:  A small hiatal hernia was present.      LA Grade A (one or more mucosal breaks less than 5 mm, not extending       between tops of 2 mucosal folds) esophagitis with no bleeding was found       38 cm from the incisors. Biopsies were taken  with a cold forceps for       histology.      Multiple 5 mm sessile polyps with no bleeding and no stigmata of recent       bleeding were found in the gastric body. Biopsies were taken with a cold       forceps for histology.      Patchy moderate inflammation characterized by erosions was found in the       prepyloric region of the stomach. Biopsies were taken with a cold       forceps for histology.      Patchy mildly erythematous mucosa without active bleeding and with no       stigmata of bleeding was found in the duodenal bulb. Impression:           - Small hiatal hernia.                       - LA Grade A reflux esophagitis. Biopsied.                       - Multiple gastric polyps. Biopsied x3.                       - Chronic gastritis. Biopsied.                       - Erythematous duodenopathy. Recommendation:       - Telephone endoscopist for pathology results in 1 week. Procedure Code(s):    --- Professional ---                       [phone], Esophagogastroduodenoscopy, flexible, transoral;                        with biopsy, single or multiple Diagnosis Code(s):    --- Professional ---                       K30, Functional dyspepsia                       K31.7, Polyp of stomach and duodenum                       K29.50, Unspecified chronic gastritis without bleeding                       K31.89, Other diseases of stomach and duodenum                       K21.0, Gastro-esophageal reflux disease with esophagitis                       K44.9, Diaphragmatic hernia without obstruction or                        gangrene CPT copyright 2018 American Medical Association. All rights reserved. The codes documented in this  report are preliminary and upon coder review may  be revised to meet current compliance requirements. Earline Mayotte, MD 11/06/2018 9:26:31 AM This report has been signed electronically. Number of Addenda: 0 Note Initiated On: 11/06/2018 9:00 AM      [facility]

## 2018-11-06 NOTE — Anesthesia Preprocedure Evaluation (Signed)
Anesthesia Evaluation  Patient identified by MRN, date of birth, ID band Patient awake    Reviewed: Allergy & Precautions, H&P , NPO status , Patient's Chart, lab work & pertinent test results, reviewed documented beta blocker date and time   Airway Mallampati: II   Neck ROM: full    Dental  (+) Teeth Intact   Pulmonary neg pulmonary ROS, Current Smoker,    Pulmonary exam normal        Cardiovascular Exercise Tolerance: Good negative cardio ROS Normal cardiovascular exam Rhythm:regular Rate:Normal     Neuro/Psych Seizures -,  PSYCHIATRIC DISORDERS Depression negative neurological ROS  negative psych ROS   GI/Hepatic negative GI ROS, Neg liver ROS, GERD  Medicated,  Endo/Other  negative endocrine ROS  Renal/GU negative Renal ROS  negative genitourinary   Musculoskeletal   Abdominal   Peds  Hematology negative hematology ROS (+)   Anesthesia Other Findings Past Medical History: No date: Depression No date: GERD (gastroesophageal reflux disease) No date: Seizures (HCC) Past Surgical History: No date: ABDOMINAL HYSTERECTOMY; Left     Comment:  one ovary remaining 1991: BRAIN TUMOR EXCISION 2009: CERVICAL FUSION No date: CHOLECYSTECTOMY   Reproductive/Obstetrics negative OB ROS                             Anesthesia Physical Anesthesia Plan  ASA: II  Anesthesia Plan: General   Post-op Pain Management:    Induction:   PONV Risk Score and Plan:   Airway Management Planned:   Additional Equipment:   Intra-op Plan:   Post-operative Plan:   Informed Consent: I have reviewed the patients History and Physical, chart, labs and discussed the procedure including the risks, benefits and alternatives for the proposed anesthesia with the patient or authorized representative who has indicated his/her understanding and acceptance.   Dental Advisory Given  Plan Discussed with:  CRNA  Anesthesia Plan Comments:         Anesthesia Quick Evaluation

## 2018-11-07 ENCOUNTER — Encounter: Payer: Self-pay | Admitting: General Surgery

## 2018-11-07 NOTE — Anesthesia Postprocedure Evaluation (Signed)
Anesthesia Post Note  Patient: Leah Carpenter  Procedure(s) Performed: ESOPHAGOGASTRODUODENOSCOPY (EGD) WITH PROPOFOL (N/A )  Patient location during evaluation: PACU Anesthesia Type: General Level of consciousness: awake and alert Pain management: pain level controlled Vital Signs Assessment: post-procedure vital signs reviewed and stable Respiratory status: spontaneous breathing, nonlabored ventilation, respiratory function stable and patient connected to nasal cannula oxygen Cardiovascular status: blood pressure returned to baseline and stable Postop Assessment: no apparent nausea or vomiting Anesthetic complications: no     Last Vitals:  Vitals:   11/06/18 0940 11/06/18 0950  BP: 99/68 97/67  Pulse: 72   Resp: 13 13  Temp:    SpO2: 98% 99%    Last Pain:  Vitals:   11/06/18 0920  TempSrc: Tympanic                 Yevette EdwardsJames G Halen Mossbarger

## 2018-11-08 LAB — SURGICAL PATHOLOGY

## 2018-11-11 ENCOUNTER — Telehealth: Payer: Self-pay

## 2018-11-11 NOTE — Telephone Encounter (Signed)
-----   Message from Earline MayotteJeffrey W Byrnett, MD sent at 11/11/2018  4:21 PM EST ----- Notify biopsies OK, just evidence of reflux. Add tums or liquid antiacid at bedtime. Be sure she has a f/u appointment after virtual colonoscopy scheduled for 12/ 10. Thanks.  ----- Message ----- From: Interface, Lab In Three Zero One Sent: 11/08/2018   6:59 PM EST To: Earline MayotteJeffrey W Byrnett, MD

## 2018-11-12 NOTE — Telephone Encounter (Signed)
Notified patient as instructed, patient pleased. Discussed follow-up appointments, patient agrees  

## 2018-11-17 IMAGING — CT CT ABD-PEL WO/W CM
4 of 12 series · 12 of 46 positions shown, 17 images · IV contrast (iopamidol)
Comparison: 06/08/2014

CLINICAL DATA: Microhematuria for 2 weeks. Prior cholecystectomy
and partial hysterectomy.

EXAM:
CT ABDOMEN AND PELVIS WITHOUT AND WITH CONTRAST
TECHNIQUE: Multidetector CT imaging of the abdomen and pelvis was performed
following the standard protocol before and following the bolus
administration of intravenous contrast.
CONTRAST:  125mL ZBDWEP-LFF IOPAMIDOL (ZBDWEP-LFF) INJECTION 61%

[Series 2: without pre · axial · non-contrast · 0.69mm/px · z∈[-1579,-1514]mm · 2 of 89 slices shown]
[im 13/89  soft-tissue]
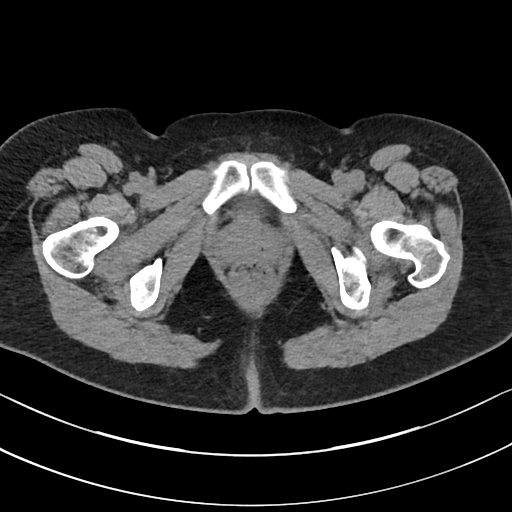
[im 26/89  soft-tissue]
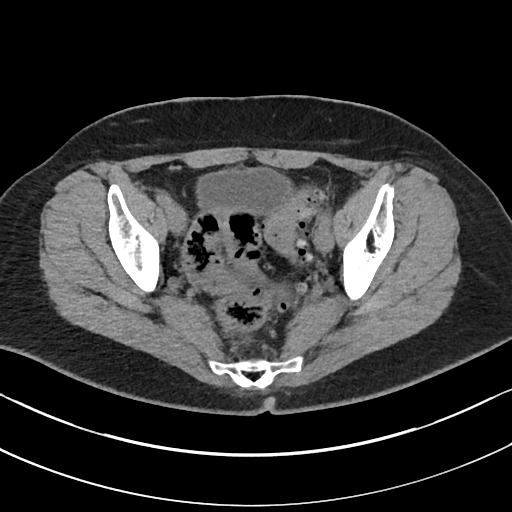

[Series 7: sag without without pre · sagittal · non-contrast · 0.50mm/px · 1 of 175 slices shown]
[im 15/175  soft-tissue]
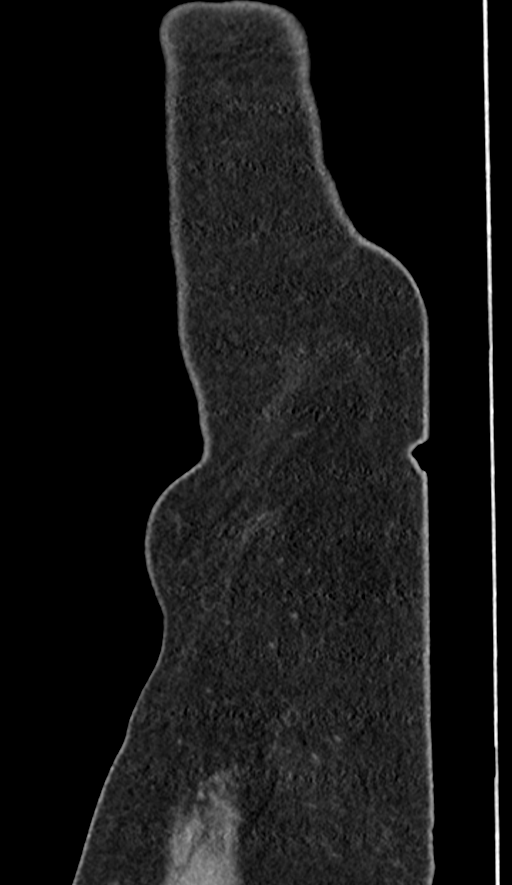

[Series 17: cor delay delay prone · coronal · delayed · 0.69mm/px · 2 of 140 slices shown]
[im 47/140  soft-tissue]
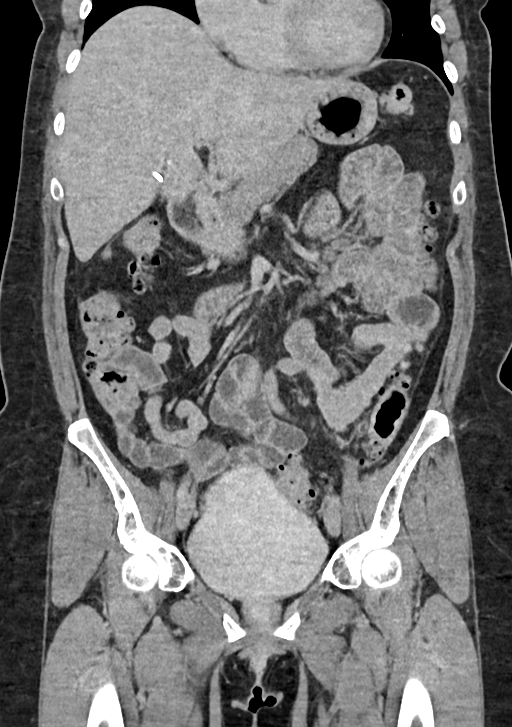
[im 93/140  soft-tissue]
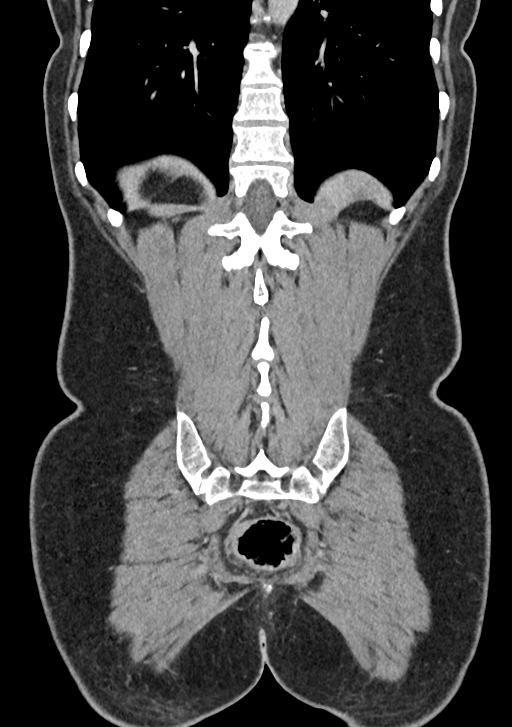

[Series 19: axial delay delay prone · axial · delayed · 0.69mm/px · z∈[-1607,-1237]mm · 7 of 100 slices shown, 12 images]
[im 13/100  soft-tissue]
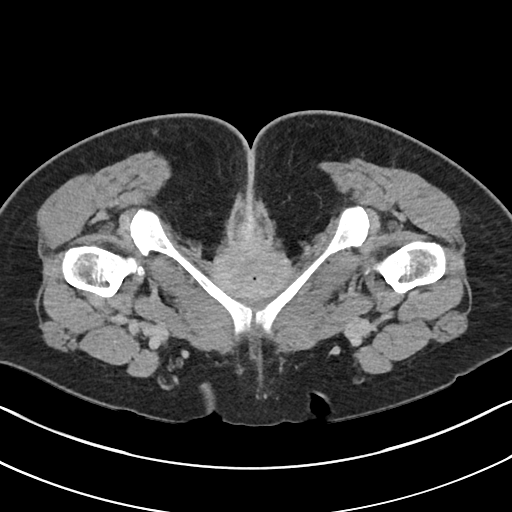
[im 13/100  bone]
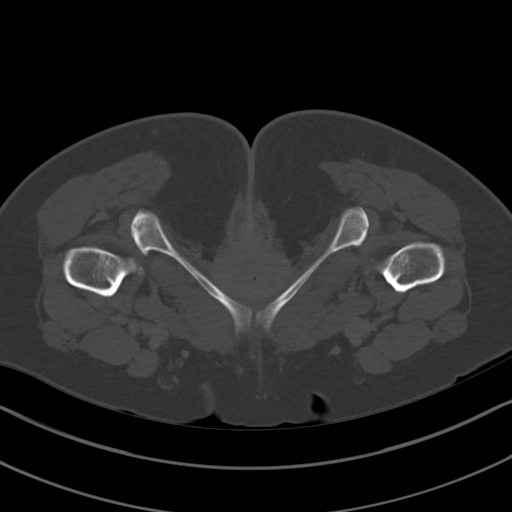
[im 25/100  soft-tissue]
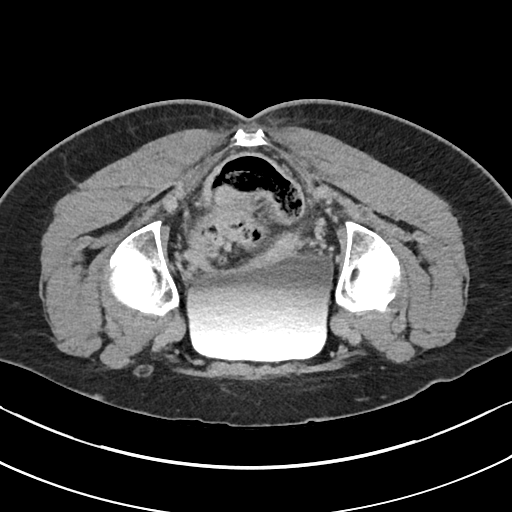
[im 38/100  soft-tissue]
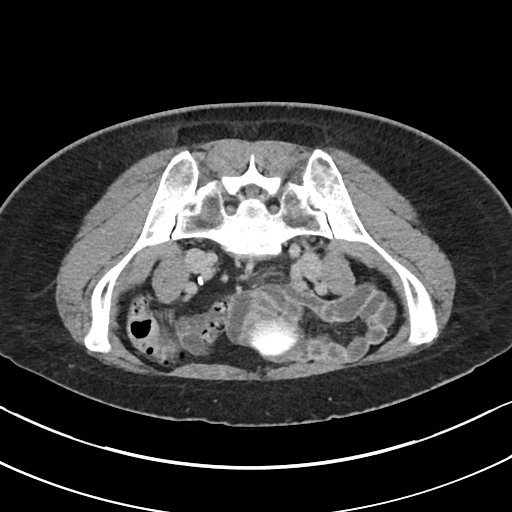
[im 50/100  soft-tissue]
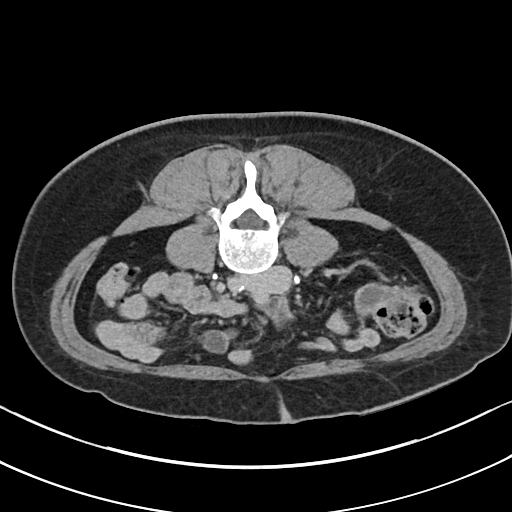
[im 50/100  lung]
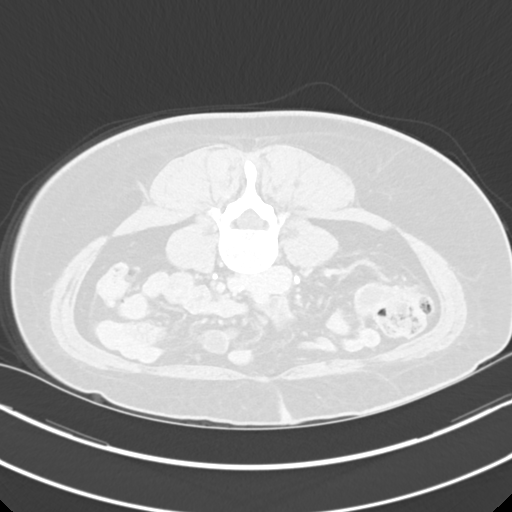
[im 62/100  soft-tissue]
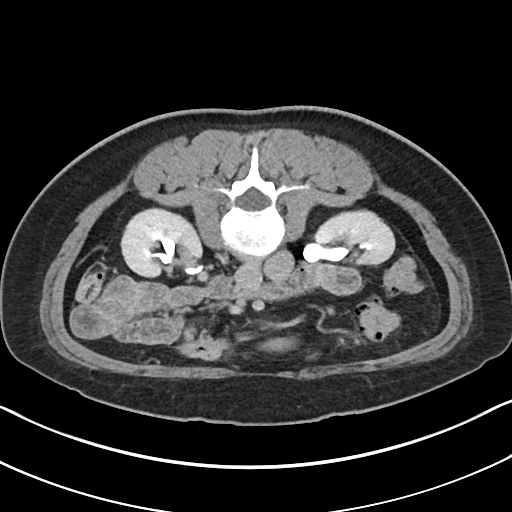
[im 62/100  lung]
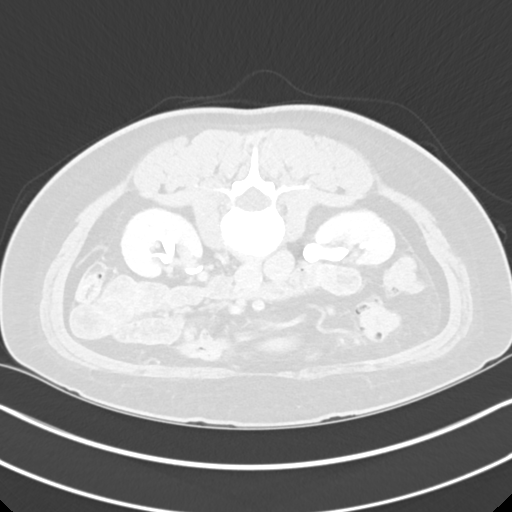
[im 75/100  soft-tissue]
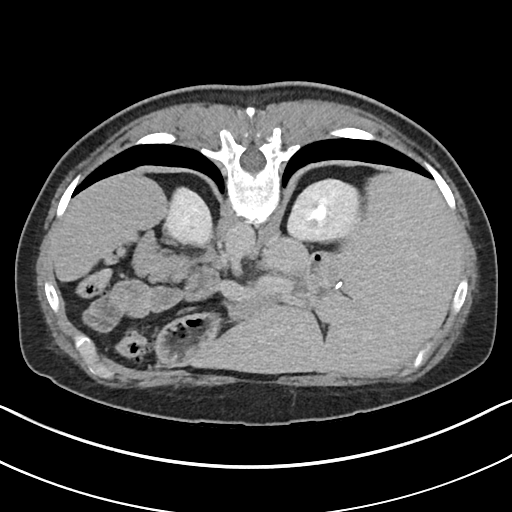
[im 75/100  lung]
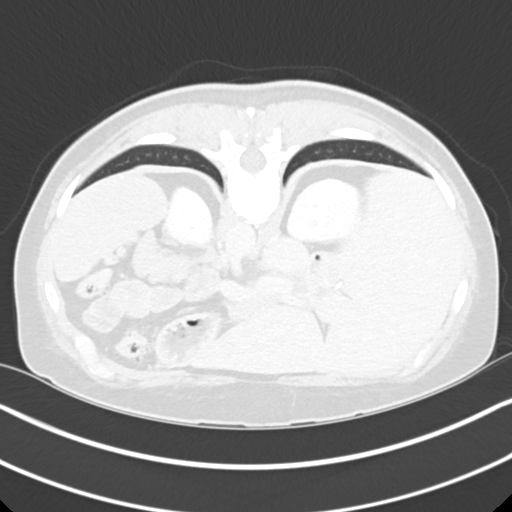
[im 87/100  soft-tissue]
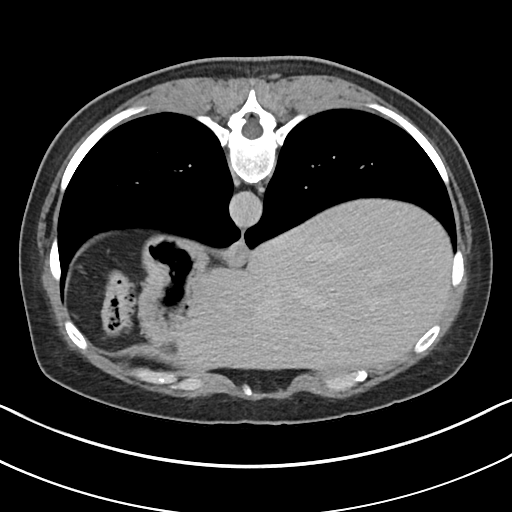
[im 87/100  lung]
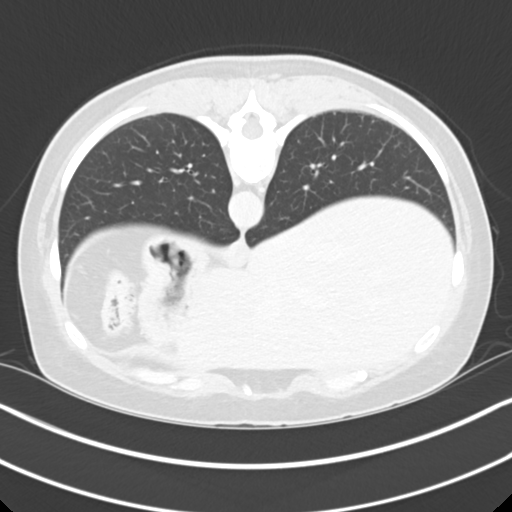

[12 of 46 positions shown; findings below may reference images not displayed]

FINDINGS: Lower chest: Unremarkable.

Hepatobiliary: No focal abnormality within the liver parenchyma.
Gallbladder surgically absent. No intrahepatic or extrahepatic
biliary dilation.

Pancreas: No focal mass lesion. No dilatation of the main duct. No
intraparenchymal cyst. No peripancreatic edema.

Spleen: No splenomegaly. No focal mass lesion.

Adrenals/Urinary Tract: No adrenal nodule or mass.

Precontrast imaging reveals no stones in either kidney or ureter. No
bladder stones.

Postcontrast imaging shows no enhancing lesion in either kidney. 6
mm hypoenhancing lesion in the interpolar left kidney is technically
too small to characterize but may represent a cyst.

Delayed imaging shows no wall thickening or soft tissue filling
defect in either intrarenal collecting system or renal pelvis.
Ureters are incompletely opacified but show no evidence for wall
thickening or mass lesion. No focal bladder wall abnormality is
evident.

Stomach/Bowel: Stomach is nondistended. No gastric wall thickening.
No evidence of outlet obstruction. Duodenum is normally positioned
as is the ligament of Treitz. No small bowel wall thickening. No
small bowel dilatation. The terminal ileum is normal. The appendix
is normal. Diverticuli are seen scattered along the entire length of
the colon without CT findings of diverticulitis.

Vascular/Lymphatic: There is abdominal aortic atherosclerosis
without aneurysm. There is no gastrohepatic or hepatoduodenal
ligament lymphadenopathy. No intraperitoneal or retroperitoneal
lymphadenopathy. No pelvic sidewall lymphadenopathy.

Reproductive: Uterus surgically absent.  There is no adnexal mass.

Other: No intraperitoneal free fluid.

Musculoskeletal: No worrisome lytic or sclerotic osseous
abnormality.
IMPRESSION: 1. No definite CT findings to explain the patient's history of
hematuria. 6 mm hypoenhancing lesion interpolar left kidney appears
new since prior study but cannot be definitively characterized.
Given the history of hematuria, follow-up MRI of the abdomen in 3-6
months recommended to further evaluate.
2. Age advanced diffuse diverticulosis of the colon.

## 2018-12-03 ENCOUNTER — Other Ambulatory Visit: Payer: Federal, State, Local not specified - PPO

## 2018-12-10 ENCOUNTER — Ambulatory Visit: Payer: Federal, State, Local not specified - PPO | Admitting: General Surgery

## 2019-01-13 ENCOUNTER — Telehealth: Payer: Self-pay | Admitting: General Surgery

## 2019-01-13 NOTE — Telephone Encounter (Signed)
Patient is calling said dr byrnett had send her to [facility] to have some images done, and there machine has been down for a couple months now and is asking if we could send her somewhere else. Please call patient and advise.

## 2019-01-13 NOTE — Telephone Encounter (Signed)
Spoke with the patient and she is scheduled to see Harmon Dun, Dr Elliott's PA on 01/20/19 at 3:00 pm.

## 2019-01-13 NOTE — Telephone Encounter (Signed)
Talked with Caryl-La LPN. Referred  back to Dr. Mechele Collin for care.

## 2019-06-17 ENCOUNTER — Encounter: Payer: Self-pay | Admitting: *Deleted

## 2019-06-17 NOTE — Progress Notes (Signed)
We received a call from [facility] at the [place] (Phone: [phone]: [phone]). She states that the patient is requesting a copy of a disc from her CT colonography to be sent to our office for Dr. Dwyane Luo review. They are requesting that we send over a request for the above with the MD's name and address.   This has been faxed over as requested today.

## 2019-06-25 ENCOUNTER — Encounter: Payer: Self-pay | Admitting: General Surgery

## 2019-06-26 ENCOUNTER — Telehealth: Payer: Self-pay | Admitting: *Deleted

## 2019-06-26 NOTE — Telephone Encounter (Signed)
Patient contacted today and notified as instructed. She is schedule for an appointment to see Dr. Bary Castilla on 07-08-19 at 2:30 pm.

## 2019-06-26 NOTE — Telephone Encounter (Signed)
-----   Message from Robert Bellow, MD sent at 06/26/2019  2:46 PM EDT ----- Please notify the patient I have reviewed the CT colonoscopy from [place].  No stricture, just twists and turns from her hysterectomy.   Please arrange office follow up to discuss options.

## 2019-07-07 ENCOUNTER — Encounter: Payer: Self-pay | Admitting: General Surgery

## 2019-07-07 ENCOUNTER — Telehealth: Payer: Self-pay | Admitting: *Deleted

## 2019-07-07 NOTE — Telephone Encounter (Signed)
Patient cancelled appt. On 06/08/2019.

## 2019-07-08 ENCOUNTER — Ambulatory Visit: Payer: Federal, State, Local not specified - PPO | Admitting: General Surgery

## 2019-07-31 ENCOUNTER — Ambulatory Visit: Payer: Federal, State, Local not specified - PPO | Admitting: General Surgery

## 2019-08-13 ENCOUNTER — Other Ambulatory Visit: Payer: Self-pay

## 2019-08-13 ENCOUNTER — Ambulatory Visit (INDEPENDENT_AMBULATORY_CARE_PROVIDER_SITE_OTHER): Payer: Federal, State, Local not specified - PPO | Admitting: Surgery

## 2019-08-13 ENCOUNTER — Encounter: Payer: Self-pay | Admitting: *Deleted

## 2019-08-13 ENCOUNTER — Encounter: Payer: Self-pay | Admitting: Surgery

## 2019-08-13 VITALS — BP 139/98 | HR 76 | Temp 97.5°F | Resp 16 | Ht 63.0 in | Wt 157.0 lb

## 2019-08-13 DIAGNOSIS — K5732 Diverticulitis of large intestine without perforation or abscess without bleeding: Secondary | ICD-10-CM

## 2019-08-13 MED ORDER — BISACODYL EC 5 MG PO TBEC: DELAYED_RELEASE_TABLET | ORAL | 0 refills | Status: DC

## 2019-08-13 MED ORDER — POLYETHYLENE GLYCOL 3350 17 GM/SCOOP PO POWD: ORAL | 0 refills | Status: DC

## 2019-08-13 MED ORDER — ERYTHROMYCIN 500 MG PO TBEC: 500.0000 mg | DELAYED_RELEASE_TABLET | ORAL | 0 refills | Status: DC

## 2019-08-13 MED ORDER — NEOMYCIN SULFATE 500 MG PO TABS: ORAL_TABLET | ORAL | 0 refills | Status: DC

## 2019-08-13 NOTE — Patient Instructions (Addendum)
We will schedule your Colon surgery.

## 2019-08-13 NOTE — Progress Notes (Addendum)
Patient's surgery to be scheduled for 09-02-19 at [facility] with Dr. Dahlia Byes. Dr. Nestor Lewandowsky will be assisting with this case.   Patient has been asked to complete a formal bowel prep. She was provided with the instructions and they were reviewed with her today.   The patient is aware to have COVID-19 testing done on 08-29-19 at the [place] building drive thru ([address] [place]) between 8:00 am and 10:30 am. She is aware to isolate after, have no visitors, wash hands frequently, and avoid touching face.   The patient is aware she will be contacted by the [facility] to complete a phone interview sometime in the near future.  Patient aware to be NPO after midnight and have a driver.   She is aware to check in at the [facility] entrance where she will be screened for the coronavirus and then sent to [facility] Day Surgery.   The patient verbalizes understanding of the above.   The patient is aware to call the office should she have further questions.

## 2019-08-14 NOTE — H&P (View-Only) (Signed)
Patient ID: Leah Carpenter, female   DOB: 02/24/1971, 48 y.o.   MRN: [id number]  HPI Leah Carpenter is a 48 y.o. female patient of Dr. Tollie Pizza for recurrent sigmoid diverticulitis.  She has had double episode of diverticulitis requiring antibiotic therapy.  More recently the patient reports having pencillike stools and some lower abdominal discomfort.  The pain is intermittent mild to moderate intensity and sharp in nature.  Patient reports that the pain is worse when she has a bowel movement.  She did have a virtual colonoscopy that I personally reviewed showing evidence of the sigmoid stricture but without evidence of any other concerning lesions.  She also had a attempted colonoscopy by Dr. Vira Agar but he was unable to pass the scope due to the stricture that was visualized.  He is able to perform more than 4 METS of activity without any shortness of breath or chest pain.  HPI  Past Medical History:  Diagnosis Date  . Depression   . GERD (gastroesophageal reflux disease)   . Seizures ([place])     Past Surgical History:  Procedure Laterality Date  . ABDOMINAL HYSTERECTOMY Left    one ovary remaining  . BRAIN TUMOR EXCISION  1991  . CERVICAL FUSION  2009  . CHOLECYSTECTOMY    . ESOPHAGOGASTRODUODENOSCOPY (EGD) WITH PROPOFOL N/A 11/06/2018   Procedure: ESOPHAGOGASTRODUODENOSCOPY (EGD) WITH PROPOFOL;  Surgeon: Robert Bellow, MD;  Location: [facility] ENDOSCOPY;  Service: Endoscopy;  Laterality: N/A;    Family History  Problem Relation Age of Onset  . Bone cancer Sister   . Uterine cancer Paternal Grandmother   . Stomach cancer Cousin        5 cousin  . Breast cancer Maternal Aunt        3 aunt  . Ovarian cancer Sister     Social History Social History   Tobacco Use  . Smoking status: Current Every Day Smoker    Types: E-cigarettes  . Smokeless tobacco: Never Used  Substance Use Topics  . Alcohol use: No  . Drug use: No    No Known Allergies  Current Outpatient  Medications  Medication Sig Dispense Refill  . hyoscyamine (LEVSIN) 0.125 MG/5ML ELIX Take 0.125 mg by mouth.    Marland Kitchen omeprazole (PRILOSEC) 40 MG capsule Take 40 mg by mouth 2 (two) times daily.    . bisacodyl (BISACODYL) 5 MG EC tablet Take 4 tablets (5 mg each) at 8 am the day before surgery. 4 tablet 0  . Erythromycin 500 MG TBEC Take 500 mg by mouth as directed. Take two tablets at 8 am, two tablets at 2 pm, and two tablets at 8 pm the day before surgery. 6 tablet 0  . neomycin (MYCIFRADIN) 500 MG tablet Take two tablets at 8 am, two tablets at 2 pm, and two tablets at 8 pm the day before surgery. 6 tablet 0  . polyethylene glycol powder (GLYCOLAX/MIRALAX) 17 GM/SCOOP powder 255 grams one bottle for bowel prep 255 g 0   No current facility-administered medications for this visit.      Review of Systems Full ROS  was asked and was negative except for the information on the HPI  Physical Exam Blood pressure (!) 139/98, pulse 76, temperature (!) 97.5 F (36.4 C), temperature source Temporal, resp. rate 16, height 5\' 3"  (1.6 m), weight 157 lb (71.2 kg), SpO2 98 %. CONSTITUTIONAL:NAD EYES: Pupils are equal, round, and reactive to light, Sclera are non-icteric. EARS, NOSE, MOUTH AND THROAT: The oropharynx is  clear. The oral mucosa is pink and moist. Hearing is intact to voice. LYMPH NODES:  Lymph nodes in the neck are normal. RESPIRATORY:  Lungs are clear. There is normal respiratory effort, with equal breath sounds bilaterally, and without pathologic use of accessory muscles. CARDIOVASCULAR: Heart is regular without murmurs, gallops, or rubs. GI: The abdomen is soft, mild TTP suprapubic area, no peritonitis, and nondistended. There are no palpable masses. There is no hepatosplenomegaly. There are normal bowel sounds in all quadrants. GU: Rectal deferred.   MUSCULOSKELETAL: Normal muscle strength and tone. No cyanosis or edema.   SKIN: Turgor is good and there are no pathologic skin lesions or  ulcers. NEUROLOGIC: Motor and sensation is grossly normal. Cranial nerves are grossly intact. PSYCH:  Oriented to person, place and time. Affect is normal.  Data Reviewed  I have personally reviewed the patient's imaging, laboratory findings and medical records.    Assessment/Plan 48 year old female with recurrent diverticulitis with sigmoid stricture.  Discussed with the patient in detail about my recommendation for elective sigmoid colectomy.  Procedure discussed with the patient in detail.  Risks, benefits and possible complications including but not limited to: Bleeding, infection, anastomotic leak prolonged hospitalization and pain.  She understands and wishes to proceed.  We will plan for colon prep and promptly schedule her for surgery  Time spent with the patient was 40 minutes, with more than 50% of the time spent in face-to-face education, counseling and care coordination.     Sterling Bigiego Pabon, MD FACS General Surgeon 08/14/2019, 5:09 PM

## 2019-08-14 NOTE — Progress Notes (Signed)
Patient ID: Leah Carpenter, female   DOB: 02/24/1971, 48 y.o.   MRN: [id number]  HPI Leah Carpenter is a 48 y.o. female patient of Dr. Tollie Pizza for recurrent sigmoid diverticulitis.  She has had double episode of diverticulitis requiring antibiotic therapy.  More recently the patient reports having pencillike stools and some lower abdominal discomfort.  The pain is intermittent mild to moderate intensity and sharp in nature.  Patient reports that the pain is worse when she has a bowel movement.  She did have a virtual colonoscopy that I personally reviewed showing evidence of the sigmoid stricture but without evidence of any other concerning lesions.  She also had a attempted colonoscopy by Dr. Vira Agar but he was unable to pass the scope due to the stricture that was visualized.  He is able to perform more than 4 METS of activity without any shortness of breath or chest pain.  HPI  Past Medical History:  Diagnosis Date  . Depression   . GERD (gastroesophageal reflux disease)   . Seizures ([place])     Past Surgical History:  Procedure Laterality Date  . ABDOMINAL HYSTERECTOMY Left    one ovary remaining  . BRAIN TUMOR EXCISION  1991  . CERVICAL FUSION  2009  . CHOLECYSTECTOMY    . ESOPHAGOGASTRODUODENOSCOPY (EGD) WITH PROPOFOL N/A 11/06/2018   Procedure: ESOPHAGOGASTRODUODENOSCOPY (EGD) WITH PROPOFOL;  Surgeon: Robert Bellow, MD;  Location: [facility] END[facility];  Service: Endoscopy;  Laterality: N/A;    Family History  Problem Relation Age of Onset  . Bone cancer Sister   . Uterine cancer Paternal Grandmother   . Stomach cancer Cousin        5 cousin  . Breast cancer Maternal Aunt        3 aunt  . Ovarian cancer Sister     Social History Social History   Tobacco Use  . Smoking status: Current Every Day Smoker    Types: E-cigarettes  . Smokeless tobacco: Never Used  Substance Use Topics  . Alcohol use: No  . Drug use: No    No Known Allergies  Current Outpatient  Medications  Medication Sig Dispense Refill  . hyoscyamine (LEVSIN) 0.125 MG/5ML ELIX Take 0.125 mg by mouth.    Marland Kitchen omeprazole (PRILOSEC) 40 MG capsule Take 40 mg by mouth 2 (two) times daily.    . bisacodyl (BISACODYL) 5 MG EC tablet Take 4 tablets (5 mg each) at 8 am the day before surgery. 4 tablet 0  . Erythromycin 500 MG TBEC Take 500 mg by mouth as directed. Take two tablets at 8 am, two tablets at 2 pm, and two tablets at 8 pm the day before surgery. 6 tablet 0  . neomycin (MYCIFRADIN) 500 MG tablet Take two tablets at 8 am, two tablets at 2 pm, and two tablets at 8 pm the day before surgery. 6 tablet 0  . polyethylene glycol powder (GLYCOLAX/MIRALAX) 17 GM/SCOOP powder 255 grams one bottle for bowel prep 255 g 0   No current facility-administered medications for this visit.      Review of Systems Full ROS  was asked and was negative except for the information on the HPI  Physical Exam Blood pressure (!) 139/98, pulse 76, temperature (!) 97.5 F (36.4 C), temperature source Temporal, resp. rate 16, height 5\' 3"  (1.6 m), weight 157 lb (71.2 kg), SpO2 98 %. CONSTITUTIONAL:NAD EYES: Pupils are equal, round, and reactive to light, Sclera are non-icteric. EARS, NOSE, MOUTH AND THROAT: The oropharynx is  clear. The oral mucosa is pink and moist. Hearing is intact to voice. LYMPH NODES:  Lymph nodes in the neck are normal. RESPIRATORY:  Lungs are clear. There is normal respiratory effort, with equal breath sounds bilaterally, and without pathologic use of accessory muscles. CARDIOVASCULAR: Heart is regular without murmurs, gallops, or rubs. GI: The abdomen is soft, mild TTP suprapubic area, no peritonitis, and nondistended. There are no palpable masses. There is no hepatosplenomegaly. There are normal bowel sounds in all quadrants. GU: Rectal deferred.   MUSCULOSKELETAL: Normal muscle strength and tone. No cyanosis or edema.   SKIN: Turgor is good and there are no pathologic skin lesions or  ulcers. NEUROLOGIC: Motor and sensation is grossly normal. Cranial nerves are grossly intact. PSYCH:  Oriented to person, place and time. Affect is normal.  Data Reviewed  I have personally reviewed the patient's imaging, laboratory findings and medical records.    Assessment/Plan 47-year-old female with recurrent diverticulitis with sigmoid stricture.  Discussed with the patient in detail about my recommendation for elective sigmoid colectomy.  Procedure discussed with the patient in detail.  Risks, benefits and possible complications including but not limited to: Bleeding, infection, anastomotic leak prolonged hospitalization and pain.  She understands and wishes to proceed.  We will plan for colon prep and promptly schedule her for surgery  Time spent with the patient was 40 minutes, with more than 50% of the time spent in face-to-face education, counseling and care coordination.     Diego Pabon, MD FACS General Surgeon 08/14/2019, 5:09 PM   

## 2019-08-27 ENCOUNTER — Other Ambulatory Visit: Payer: Self-pay

## 2019-08-27 ENCOUNTER — Encounter
Admission: RE | Admit: 2019-08-27 | Discharge: 2019-08-27 | Disposition: A | Discharge status: Home / Self Care | Payer: Federal, State, Local not specified - PPO | Source: Ambulatory Visit | Attending: Surgery | Admitting: Surgery

## 2019-08-27 HISTORY — DX: Sleep apnea, unspecified: G47.30

## 2019-08-27 NOTE — Patient Instructions (Addendum)
Your procedure is scheduled on: Tuesday 09/02/19 Report to [place]. To find out your arrival time please call [phone] between 1PM - 3PM on Monday 09/01/19.  Remember: Instructions that are not followed completely may result in serious medical risk, up to and including death, or upon the discretion of your surgeon and anesthesiologist your surgery may need to be rescheduled.     _X__ 1. Do not eat food after midnight the night before your procedure.                 No gum chewing or hard candies. You may drink clear liquids up to 2 hours                 before you are scheduled to arrive for your surgery- DO not drink clear                 liquids within 2 hours of the start of your surgery.                 Clear Liquids include:  water, apple juice without pulp, clear carbohydrate                 drink such as Clearfast or Gatorade, Black Coffee or Tea (Do not add                 anything to coffee or tea). Diabetics water only  __X__2.  On the morning of surgery brush your teeth with toothpaste and water, you                 may rinse your mouth with mouthwash if you wish.  Do not swallow any              toothpaste of mouthwash.     _X__ 3.  No Alcohol for 24 hours before or after surgery.   _X__ 4.  Do Not Smoke or use e-cigarettes For 24 Hours Prior to Your Surgery.                 Do not use any chewable tobacco products for at least 6 hours prior to                 surgery.  ____  5.  Bring all medications with you on the day of surgery if instructed.   __X__  6.  Notify your doctor if there is any change in your medical condition      (cold, fever, infections).     Do not wear jewelry, make-up, hairpins, clips or nail polish. Do not wear lotions, powders, or perfumes.  Do not shave 48 hours prior to surgery. Men may shave face and neck. Do not bring valuables to the hospital.    [facility] is not responsible for any  belongings or valuables.  Contacts, dentures/partials or body piercings may not be worn into surgery. Bring a case for your contacts, glasses or hearing aids, a denture cup will be supplied. Leave your suitcase in the car. After surgery it may be brought to your room. For patients admitted to the hospital, discharge time is determined by your treatment team.   Patients discharged the day of surgery will not be allowed to drive home.   Please read over the following fact sheets that you were given:   MRSA Information  __X__ Take these medicines the morning of surgery with A SIP OF WATER:  1. omeprazole (PRILOSEC at bedtime and the morning of surgery  2.   3.   4.  5.  6.  ____ Fleet Enema (as directed)   __X__ Use CHG Soap/SAGE wipes as directed  ____ Use inhalers on the day of surgery  ____ Stop metformin/Janumet/Farxiga 2 days prior to surgery    ____ Take 1/2 of usual insulin dose the night before surgery. No insulin the morning          of surgery.   ____ Stop Blood Thinners Coumadin/Plavix/Xarelto/Pleta/Pradaxa/Eliquis/Effient/Aspirin  on   Or contact your Surgeon, Cardiologist or Medical Doctor regarding  ability to stop your blood thinners  __X__ Stop Anti-inflammatories 7 days before surgery such as Advil, Ibuprofen, Motrin,  BC or Goodies Powder, Naprosyn, Naproxen, Aleve, Aspirin    __X__ Stop all herbal supplements, fish oil or vitamin E until after surgery.    ____ Bring C-Pap to the hospital.       Telephone interview with instructions. Patient verbalized understanding./cn

## 2019-08-29 ENCOUNTER — Other Ambulatory Visit: Payer: Self-pay

## 2019-08-29 ENCOUNTER — Other Ambulatory Visit
Admission: RE | Admit: 2019-08-29 | Discharge: 2019-08-29 | Disposition: A | Discharge status: Home / Self Care | Payer: Federal, State, Local not specified - PPO | Source: Ambulatory Visit | Attending: Surgery | Admitting: Surgery

## 2019-08-29 DIAGNOSIS — Z01812 Encounter for preprocedural laboratory examination: Secondary | ICD-10-CM | POA: Insufficient documentation

## 2019-08-29 DIAGNOSIS — Z20828 Contact with and (suspected) exposure to other viral communicable diseases: Secondary | ICD-10-CM | POA: Diagnosis not present

## 2019-08-29 LAB — CBC
HCT: 39 % (ref 36.0–46.0)
Hemoglobin: 13.3 g/dL (ref 12.0–15.0)
MCH: 31.1 pg (ref 26.0–34.0)
MCHC: 34.1 g/dL (ref 30.0–36.0)
MCV: 91.3 fL (ref 80.0–100.0)
Platelets: 260 10*3/uL (ref 150–400)
RBC: 4.27 MIL/uL (ref 3.87–5.11)
RDW: 12.7 % (ref 11.5–15.5)
WBC: 7.3 10*3/uL (ref 4.0–10.5)
nRBC: 0 % (ref 0.0–0.2)

## 2019-08-30 LAB — SARS CORONAVIRUS 2 (TAT 6-24 HRS): SARS Coronavirus 2: NEGATIVE

## 2019-09-02 ENCOUNTER — Inpatient Hospital Stay: Payer: Federal, State, Local not specified - PPO | Admitting: Certified Registered"

## 2019-09-02 ENCOUNTER — Other Ambulatory Visit: Payer: Self-pay

## 2019-09-02 ENCOUNTER — Inpatient Hospital Stay
Admission: RE | Admit: 2019-09-02 | Discharge: 2019-09-04 | DRG: 329 | Disposition: A | Discharge status: Home / Self Care | Payer: Federal, State, Local not specified - PPO | Attending: Surgery | Admitting: Surgery

## 2019-09-02 ENCOUNTER — Encounter
Admission: RE | Disposition: A | Discharge status: Home / Self Care | Payer: Self-pay | Source: Home / Self Care | Attending: Surgery

## 2019-09-02 DIAGNOSIS — Z981 Arthrodesis status: Secondary | ICD-10-CM

## 2019-09-02 DIAGNOSIS — K658 Other peritonitis: Secondary | ICD-10-CM | POA: Diagnosis present

## 2019-09-02 DIAGNOSIS — Z8603 Personal history of neoplasm of uncertain behavior: Secondary | ICD-10-CM

## 2019-09-02 DIAGNOSIS — K5720 Diverticulitis of large intestine with perforation and abscess without bleeding: Principal | ICD-10-CM | POA: Diagnosis present

## 2019-09-02 DIAGNOSIS — K5732 Diverticulitis of large intestine without perforation or abscess without bleeding: Secondary | ICD-10-CM | POA: Diagnosis present

## 2019-09-02 DIAGNOSIS — Z8041 Family history of malignant neoplasm of ovary: Secondary | ICD-10-CM | POA: Diagnosis not present

## 2019-09-02 DIAGNOSIS — K660 Peritoneal adhesions (postprocedural) (postinfection): Secondary | ICD-10-CM | POA: Diagnosis present

## 2019-09-02 DIAGNOSIS — Z800 Family history of malignant neoplasm of digestive organs: Secondary | ICD-10-CM | POA: Diagnosis not present

## 2019-09-02 DIAGNOSIS — F17290 Nicotine dependence, other tobacco product, uncomplicated: Secondary | ICD-10-CM | POA: Diagnosis present

## 2019-09-02 DIAGNOSIS — K219 Gastro-esophageal reflux disease without esophagitis: Secondary | ICD-10-CM | POA: Diagnosis present

## 2019-09-02 DIAGNOSIS — G4730 Sleep apnea, unspecified: Secondary | ICD-10-CM | POA: Diagnosis present

## 2019-09-02 DIAGNOSIS — Z803 Family history of malignant neoplasm of breast: Secondary | ICD-10-CM

## 2019-09-02 DIAGNOSIS — Z79899 Other long term (current) drug therapy: Secondary | ICD-10-CM | POA: Diagnosis not present

## 2019-09-02 DIAGNOSIS — Z90710 Acquired absence of both cervix and uterus: Secondary | ICD-10-CM | POA: Diagnosis not present

## 2019-09-02 DIAGNOSIS — Z8049 Family history of malignant neoplasm of other genital organs: Secondary | ICD-10-CM

## 2019-09-02 DIAGNOSIS — Z23 Encounter for immunization: Secondary | ICD-10-CM | POA: Diagnosis not present

## 2019-09-02 HISTORY — PX: LAPAROSCOPIC SIGMOID COLECTOMY: SHX5928

## 2019-09-02 LAB — CREATININE, SERUM
Creatinine, Ser: 0.81 mg/dL (ref 0.44–1.00)
GFR calc Af Amer: >60 mL/min (ref >60)
GFR calc non Af Amer: >60 mL/min (ref >60)

## 2019-09-02 LAB — CBC
HCT: 39 % (ref 36.0–46.0)
Hemoglobin: 13 g/dL (ref 12.0–15.0)
MCH: 31.1 pg (ref 26.0–34.0)
MCHC: 33.3 g/dL (ref 30.0–36.0)
MCV: 93.3 fL (ref 80.0–100.0)
Platelets: 231 10*3/uL (ref 150–400)
RBC: 4.18 MIL/uL (ref 3.87–5.11)
RDW: 13 % (ref 11.5–15.5)
WBC: 14.7 10*3/uL — ABNORMAL HIGH (ref 4.0–10.5)
nRBC: 0 % (ref 0.0–0.2)

## 2019-09-02 SURGERY — COLECTOMY, SIGMOID, LAPAROSCOPIC
Anesthesia: General

## 2019-09-02 MED ORDER — HYDROMORPHONE HCL 1 MG/ML IJ SOLN
INTRAMUSCULAR | Status: DC | PRN
  Administered 2019-09-02: .3 mg via INTRAVENOUS
  Administered 2019-09-02: .1 mg via INTRAVENOUS

## 2019-09-02 MED ORDER — ROCURONIUM BROMIDE 100 MG/10ML IV SOLN
INTRAVENOUS | Status: DC | PRN
  Administered 2019-09-02: 30 mg via INTRAVENOUS
  Administered 2019-09-02: 10 mg via INTRAVENOUS
  Administered 2019-09-02: 20 mg via INTRAVENOUS
  Administered 2019-09-02 (×2): 10 mg via INTRAVENOUS
  Administered 2019-09-02: 20 mg via INTRAVENOUS
  Administered 2019-09-02 (×2): 10 mg via INTRAVENOUS

## 2019-09-02 MED ORDER — ONDANSETRON HCL 4 MG/2ML IJ SOLN
INTRAMUSCULAR | Status: DC | PRN
  Administered 2019-09-02: 4 mg via INTRAVENOUS

## 2019-09-02 MED ORDER — FENTANYL CITRATE (PF) 250 MCG/5ML IJ SOLN
INTRAMUSCULAR | Status: AC
  Filled 2019-09-02: qty 5

## 2019-09-02 MED ORDER — CELECOXIB 200 MG PO CAPS
ORAL_CAPSULE | ORAL | Status: AC
  Administered 2019-09-02: 09:00:00 200 mg via ORAL
  Filled 2019-09-02: qty 1

## 2019-09-02 MED ORDER — BUPIVACAINE LIPOSOME 1.3 % IJ SUSP
INTRAMUSCULAR | Status: DC | PRN
  Administered 2019-09-02: 20 mL

## 2019-09-02 MED ORDER — SODIUM CHLORIDE 0.9 % IV SOLN
1.0000 g | INTRAVENOUS | Status: AC
  Administered 2019-09-02: 1 g via INTRAVENOUS
  Filled 2019-09-02: qty 1

## 2019-09-02 MED ORDER — ONDANSETRON 4 MG PO TBDP: 4.0000 mg | ORAL_TABLET | Freq: Four times a day (QID) | ORAL | Status: DC | PRN

## 2019-09-02 MED ORDER — DEXTROSE-NACL 5-0.9 % IV SOLN
INTRAVENOUS | Status: DC
  Administered 2019-09-02 – 2019-09-04 (×4): via INTRAVENOUS

## 2019-09-02 MED ORDER — PROPOFOL 10 MG/ML IV BOLUS
INTRAVENOUS | Status: AC
  Filled 2019-09-02: qty 20

## 2019-09-02 MED ORDER — PANTOPRAZOLE SODIUM 40 MG IV SOLR
40.0000 mg | Freq: Every day | INTRAVENOUS | Status: DC
  Administered 2019-09-02 – 2019-09-03 (×2): 40 mg via INTRAVENOUS
  Filled 2019-09-02 (×2): qty 40

## 2019-09-02 MED ORDER — ROCURONIUM BROMIDE 50 MG/5ML IV SOLN
INTRAVENOUS | Status: AC
  Filled 2019-09-02: qty 1

## 2019-09-02 MED ORDER — ACETAMINOPHEN 500 MG PO TABS
ORAL_TABLET | ORAL | Status: AC
  Administered 2019-09-02: 1000 mg via ORAL
  Filled 2019-09-02: qty 2

## 2019-09-02 MED ORDER — SUGAMMADEX SODIUM 200 MG/2ML IV SOLN
INTRAVENOUS | Status: DC | PRN
  Administered 2019-09-02: 150 mg via INTRAVENOUS

## 2019-09-02 MED ORDER — SODIUM CHLORIDE FLUSH 0.9 % IV SOLN
INTRAVENOUS | Status: AC
  Filled 2019-09-02: qty 50

## 2019-09-02 MED ORDER — FENTANYL CITRATE (PF) 100 MCG/2ML IJ SOLN
INTRAMUSCULAR | Status: AC
  Administered 2019-09-02: 25 ug via INTRAVENOUS
  Filled 2019-09-02: qty 2

## 2019-09-02 MED ORDER — EPHEDRINE SULFATE 50 MG/ML IJ SOLN
INTRAMUSCULAR | Status: DC | PRN
  Administered 2019-09-02: 20 mg via INTRAVENOUS
  Administered 2019-09-02 (×3): 5 mg via INTRAVENOUS

## 2019-09-02 MED ORDER — ACETAMINOPHEN 500 MG PO TABS
1000.0000 mg | ORAL_TABLET | Freq: Four times a day (QID) | ORAL | Status: DC
  Administered 2019-09-02 – 2019-09-04 (×7): 1000 mg via ORAL
  Filled 2019-09-02 (×8): qty 2

## 2019-09-02 MED ORDER — MIDAZOLAM HCL 2 MG/2ML IJ SOLN
INTRAMUSCULAR | Status: AC
  Filled 2019-09-02: qty 2

## 2019-09-02 MED ORDER — SODIUM CHLORIDE (PF) 0.9 % IJ SOLN
INTRAMUSCULAR | Status: AC
  Filled 2019-09-02: qty 10

## 2019-09-02 MED ORDER — PROPOFOL 10 MG/ML IV BOLUS
INTRAVENOUS | Status: DC | PRN
  Administered 2019-09-02: 140 mg via INTRAVENOUS

## 2019-09-02 MED ORDER — DEXAMETHASONE SODIUM PHOSPHATE 10 MG/ML IJ SOLN
INTRAMUSCULAR | Status: AC
  Filled 2019-09-02: qty 1

## 2019-09-02 MED ORDER — BUPIVACAINE LIPOSOME 1.3 % IJ SUSP
INTRAMUSCULAR | Status: AC
  Filled 2019-09-02: qty 20

## 2019-09-02 MED ORDER — SUCCINYLCHOLINE CHLORIDE 20 MG/ML IJ SOLN
INTRAMUSCULAR | Status: DC | PRN
  Administered 2019-09-02: 100 mg via INTRAVENOUS

## 2019-09-02 MED ORDER — EPHEDRINE SULFATE 50 MG/ML IJ SOLN
INTRAMUSCULAR | Status: AC
  Filled 2019-09-02: qty 1

## 2019-09-02 MED ORDER — CHLORHEXIDINE GLUCONATE CLOTH 2 % EX PADS: 6.0000 | MEDICATED_PAD | Freq: Once | CUTANEOUS | Status: DC

## 2019-09-02 MED ORDER — ONDANSETRON HCL 4 MG/2ML IJ SOLN: 4.0000 mg | Freq: Four times a day (QID) | INTRAMUSCULAR | Status: DC | PRN

## 2019-09-02 MED ORDER — INFLUENZA VAC SPLIT QUAD 0.5 ML IM SUSY
0.5000 mL | PREFILLED_SYRINGE | INTRAMUSCULAR | Status: AC
  Administered 2019-09-04: 09:00:00 0.5 mL via INTRAMUSCULAR
  Filled 2019-09-02 (×2): qty 0.5

## 2019-09-02 MED ORDER — FENTANYL CITRATE (PF) 100 MCG/2ML IJ SOLN
INTRAMUSCULAR | Status: DC | PRN
  Administered 2019-09-02: 100 ug via INTRAVENOUS
  Administered 2019-09-02 (×3): 50 ug via INTRAVENOUS

## 2019-09-02 MED ORDER — ACETAMINOPHEN 500 MG PO TABS
1000.0000 mg | ORAL_TABLET | ORAL | Status: AC
  Administered 2019-09-02: 09:00:00 1000 mg via ORAL

## 2019-09-02 MED ORDER — SODIUM CHLORIDE (PF) 0.9 % IJ SOLN
INTRAMUSCULAR | Status: DC | PRN
  Administered 2019-09-02: 50 mL

## 2019-09-02 MED ORDER — CELECOXIB 200 MG PO CAPS
200.0000 mg | ORAL_CAPSULE | ORAL | Status: AC
  Administered 2019-09-02: 09:00:00 200 mg via ORAL

## 2019-09-02 MED ORDER — LIDOCAINE HCL (CARDIAC) PF 100 MG/5ML IV SOSY
PREFILLED_SYRINGE | INTRAVENOUS | Status: DC | PRN
  Administered 2019-09-02: 100 mg via INTRAVENOUS

## 2019-09-02 MED ORDER — SUCCINYLCHOLINE CHLORIDE 20 MG/ML IJ SOLN
INTRAMUSCULAR | Status: AC
  Filled 2019-09-02: qty 1

## 2019-09-02 MED ORDER — LIDOCAINE HCL (PF) 2 % IJ SOLN
INTRAMUSCULAR | Status: AC
  Filled 2019-09-02: qty 10

## 2019-09-02 MED ORDER — SUGAMMADEX SODIUM 200 MG/2ML IV SOLN
INTRAVENOUS | Status: AC
  Filled 2019-09-02: qty 2

## 2019-09-02 MED ORDER — OXYCODONE HCL 5 MG PO TABS
5.0000 mg | ORAL_TABLET | ORAL | Status: DC | PRN
  Administered 2019-09-03: 5 mg via ORAL
  Administered 2019-09-03 – 2019-09-04 (×2): 10 mg via ORAL
  Filled 2019-09-02: qty 1
  Filled 2019-09-02: qty 2
  Filled 2019-09-02: qty 1

## 2019-09-02 MED ORDER — DEXAMETHASONE SODIUM PHOSPHATE 10 MG/ML IJ SOLN
INTRAMUSCULAR | Status: DC | PRN
  Administered 2019-09-02: 10 mg via INTRAVENOUS

## 2019-09-02 MED ORDER — ENOXAPARIN SODIUM 40 MG/0.4ML ~~LOC~~ SOLN
40.0000 mg | SUBCUTANEOUS | Status: DC
  Administered 2019-09-03 – 2019-09-04 (×2): 40 mg via SUBCUTANEOUS
  Filled 2019-09-02 (×2): qty 0.4

## 2019-09-02 MED ORDER — GABAPENTIN 300 MG PO CAPS
300.0000 mg | ORAL_CAPSULE | ORAL | Status: AC
  Administered 2019-09-02: 09:00:00 300 mg via ORAL

## 2019-09-02 MED ORDER — PROCHLORPERAZINE MALEATE 10 MG PO TABS
10.0000 mg | ORAL_TABLET | Freq: Four times a day (QID) | ORAL | Status: DC | PRN
  Filled 2019-09-02: qty 1

## 2019-09-02 MED ORDER — KETOROLAC TROMETHAMINE 30 MG/ML IJ SOLN
30.0000 mg | Freq: Four times a day (QID) | INTRAMUSCULAR | Status: DC
  Administered 2019-09-02 – 2019-09-04 (×8): 30 mg via INTRAVENOUS
  Filled 2019-09-02 (×8): qty 1

## 2019-09-02 MED ORDER — EVICEL 5 ML EX KIT
PACK | CUTANEOUS | Status: DC | PRN
  Administered 2019-09-02: 5 mL

## 2019-09-02 MED ORDER — ONDANSETRON HCL 4 MG/2ML IJ SOLN: 4.0000 mg | Freq: Once | INTRAMUSCULAR | Status: DC | PRN

## 2019-09-02 MED ORDER — MIDAZOLAM HCL 2 MG/2ML IJ SOLN
INTRAMUSCULAR | Status: DC | PRN
  Administered 2019-09-02: 2 mg via INTRAVENOUS

## 2019-09-02 MED ORDER — GABAPENTIN 300 MG PO CAPS
ORAL_CAPSULE | ORAL | Status: AC
  Administered 2019-09-02: 300 mg via ORAL
  Filled 2019-09-02: qty 1

## 2019-09-02 MED ORDER — HYDROMORPHONE HCL 1 MG/ML IJ SOLN
INTRAMUSCULAR | Status: AC
  Filled 2019-09-02: qty 1

## 2019-09-02 MED ORDER — PHENYLEPHRINE HCL (PRESSORS) 10 MG/ML IV SOLN
INTRAVENOUS | Status: DC | PRN
  Administered 2019-09-02: 100 ug via INTRAVENOUS
  Administered 2019-09-02: 200 ug via INTRAVENOUS
  Administered 2019-09-02 (×3): 100 ug via INTRAVENOUS
  Administered 2019-09-02: 200 ug via INTRAVENOUS
  Administered 2019-09-02 (×2): 100 ug via INTRAVENOUS

## 2019-09-02 MED ORDER — PROCHLORPERAZINE EDISYLATE 10 MG/2ML IJ SOLN
5.0000 mg | Freq: Four times a day (QID) | INTRAMUSCULAR | Status: DC | PRN
  Filled 2019-09-02: qty 2

## 2019-09-02 MED ORDER — BUPIVACAINE-EPINEPHRINE (PF) 0.25% -1:200000 IJ SOLN
INTRAMUSCULAR | Status: DC | PRN
  Administered 2019-09-02: 30 mL

## 2019-09-02 MED ORDER — MORPHINE SULFATE (PF) 2 MG/ML IV SOLN
2.0000 mg | INTRAVENOUS | Status: DC | PRN
  Administered 2019-09-02 – 2019-09-03 (×2): 2 mg via INTRAVENOUS
  Filled 2019-09-02 (×2): qty 1

## 2019-09-02 MED ORDER — BUPIVACAINE-EPINEPHRINE (PF) 0.25% -1:200000 IJ SOLN
INTRAMUSCULAR | Status: AC
  Filled 2019-09-02: qty 30

## 2019-09-02 MED ORDER — FENTANYL CITRATE (PF) 100 MCG/2ML IJ SOLN
25.0000 ug | INTRAMUSCULAR | Status: AC | PRN
  Administered 2019-09-02 (×6): 25 ug via INTRAVENOUS

## 2019-09-02 MED ORDER — ONDANSETRON HCL 4 MG/2ML IJ SOLN
INTRAMUSCULAR | Status: AC
  Filled 2019-09-02: qty 2

## 2019-09-02 MED ORDER — LACTATED RINGERS IV SOLN
INTRAVENOUS | Status: DC
  Administered 2019-09-02 (×2): via INTRAVENOUS

## 2019-09-02 SURGICAL SUPPLY — 78 items
APPLIER CLIP 13 LRG OPEN (CLIP)
BAG DECANTER FOR FLEXI CONT (MISCELLANEOUS) ×1 IMPLANT
BLADE CLIPPER SURG (BLADE) ×2 IMPLANT
BLADE SURG 15 STRL LF DISP TIS (BLADE) ×1 IMPLANT
BLADE SURG 15 STRL SS (BLADE) ×1
BLADE SURG SZ10 CARB STEEL (BLADE) ×2 IMPLANT
BLADE SURG SZ11 CARB STEEL (BLADE) ×2 IMPLANT
BULB RESERV EVAC DRAIN JP 100C (MISCELLANEOUS) ×2 IMPLANT
CANISTER SUCT 1200ML W/VALVE (MISCELLANEOUS) ×2 IMPLANT
CATH ROBINSON RED 14FR (CATHETERS) ×1 IMPLANT
CATH URET ROBINSON RED 14FR (CATHETERS)
CHLORAPREP W/TINT 26 (MISCELLANEOUS) ×2 IMPLANT
CLIP APPLIE 13 LRG OPEN (CLIP) ×1 IMPLANT
COVER WAND RF STERILE (DRAPES) ×2 IMPLANT
DEFOGGER SCOPE WARMER CLEARIFY (MISCELLANEOUS) ×2 IMPLANT
DERMABOND ADVANCED (GAUZE/BANDAGES/DRESSINGS)
DERMABOND ADVANCED .7 DNX12 (GAUZE/BANDAGES/DRESSINGS) ×2 IMPLANT
DRAIN CHANNEL JP 15F RND 16 (MISCELLANEOUS) ×1 IMPLANT
DRAPE INCISE IOBAN 66X45 STRL (DRAPES) ×2 IMPLANT
DRAPE LEGGINS SURG 28X43 STRL (DRAPES) ×2 IMPLANT
DRAPE UNDER BUTTOCK W/FLU (DRAPES) ×2 IMPLANT
ELECT BLADE 6.5 EXT (BLADE) ×2 IMPLANT
ELECT REM PT RETURN 9FT ADLT (ELECTROSURGICAL) ×2
ELECTRODE REM PT RTRN 9FT ADLT (ELECTROSURGICAL) ×1 IMPLANT
GAUZE SPONGE 4X4 12PLY STRL (GAUZE/BANDAGES/DRESSINGS) ×1 IMPLANT
GLOVE BIO SURGEON STRL SZ7 (GLOVE) ×10 IMPLANT
GOWN STRL REUS W/TWL LRG LVL4 (GOWN DISPOSABLE) ×12 IMPLANT
HANDLE SUCTION POOLE (INSTRUMENTS) ×1 IMPLANT
HANDLE YANKAUER SUCT BULB TIP (MISCELLANEOUS) ×1 IMPLANT
IRRIGATION STRYKERFLOW (MISCELLANEOUS) ×1 IMPLANT
IRRIGATOR STRYKERFLOW (MISCELLANEOUS) ×2
IV NS 1000ML (IV SOLUTION) ×1
IV NS 1000ML BAXH (IV SOLUTION) ×1 IMPLANT
JACKSON PRATT 10 (INSTRUMENTS) ×1 IMPLANT
L-HOOK LAP DISP 36CM (ELECTROSURGICAL) ×2
LHOOK LAP DISP 36CM (ELECTROSURGICAL) ×1 IMPLANT
MARKER SKIN DUAL TIP RULER LAB (MISCELLANEOUS) ×1 IMPLANT
NEEDLE HYPO 22GX1.5 SAFETY (NEEDLE) ×2 IMPLANT
NS IRRIG 1000ML POUR BTL (IV SOLUTION) ×2 IMPLANT
PACK COLON CLEAN CLOSURE (MISCELLANEOUS) ×2 IMPLANT
PACK LAP CHOLECYSTECTOMY (MISCELLANEOUS) ×2 IMPLANT
PENCIL ELECTRO HAND CTR (MISCELLANEOUS) ×1 IMPLANT
RELOAD STAPLE 60 2.6 WHT THN (STAPLE) IMPLANT
RELOAD STAPLE 60 3.6 BLU REG (STAPLE) ×1 IMPLANT
RELOAD STAPLER BLUE 60MM (STAPLE) ×3 IMPLANT
RELOAD STAPLER WHITE 60MM (STAPLE) ×1 IMPLANT
SCISSORS METZENBAUM CVD 33 (INSTRUMENTS) ×2 IMPLANT
SET TUBE SMOKE EVAC HIGH FLOW (TUBING) ×2 IMPLANT
SHEARS HARMONIC ACE PLUS 36CM (ENDOMECHANICALS) ×2 IMPLANT
SLEEVE ENDOPATH XCEL 5M (ENDOMECHANICALS) ×2 IMPLANT
SPONGE LAP 18X18 RF (DISPOSABLE) ×4 IMPLANT
STAPLE ECHEON FLEX 60 POW ENDO (STAPLE) ×1 IMPLANT
STAPLER CIRCULAR 29MM (STAPLE) ×3 IMPLANT
STAPLER ENDO ILS CVD 18 33 (STAPLE) IMPLANT
STAPLER PROX 25M (MISCELLANEOUS) IMPLANT
STAPLER RELOAD BLUE 60MM (STAPLE) ×6
STAPLER RELOAD WHITE 60MM (STAPLE) ×2
STAPLER SKIN PROX 35W (STAPLE) ×2 IMPLANT
SUCT SIGMOIDOSCOPE TIP 18 W/TU (SUCTIONS) ×1 IMPLANT
SUCTION POOLE HANDLE (INSTRUMENTS)
SUT MNCRL AB 4-0 PS2 18 (SUTURE) ×4 IMPLANT
SUT PDS AB 0 CT1 27 (SUTURE) ×4 IMPLANT
SUT SILK 2 0 (SUTURE) ×1
SUT SILK 2 0SH CR/8 30 (SUTURE) ×2 IMPLANT
SUT SILK 2-0 (SUTURE) ×2 IMPLANT
SUT SILK 2-0 18XBRD TIE 12 (SUTURE) ×1 IMPLANT
SUT SILK 3-0 (SUTURE) ×2 IMPLANT
SUT VIC AB 2-0 SH 27 (SUTURE) ×2
SUT VIC AB 2-0 SH 27XBRD (SUTURE) ×2 IMPLANT
SYR 20ML LL LF (SYRINGE) ×4 IMPLANT
SYR 50ML LL SCALE MARK (SYRINGE) ×2 IMPLANT
SYRINGE IRR TOOMEY STRL 70CC (SYRINGE) ×1 IMPLANT
SYS LAPSCP GELPORT 120MM (MISCELLANEOUS) ×2
SYSTEM LAPSCP GELPORT 120MM (MISCELLANEOUS) ×1 IMPLANT
TOWEL OR 17X26 4PK STRL BLUE (TOWEL DISPOSABLE) ×3 IMPLANT
TRAY FOLEY MTR SLVR 16FR STAT (SET/KITS/TRAYS/PACK) ×2 IMPLANT
TROCAR XCEL 12X100 BLDLESS (ENDOMECHANICALS) ×2 IMPLANT
TROCAR XCEL NON-BLD 5MMX100MML (ENDOMECHANICALS) ×2 IMPLANT

## 2019-09-02 NOTE — Anesthesia Post-op Follow-up Note (Signed)
Anesthesia QCDR form completed.        

## 2019-09-02 NOTE — Anesthesia Procedure Notes (Signed)
Procedure Name: Intubation Date/Time: 09/02/2019 10:31 AM Performed by: Rona Ravens, CRNA Pre-anesthesia Checklist: Patient identified, Emergency Drugs available, Suction available, Patient being monitored and Timeout performed Patient Re-evaluated:Patient Re-evaluated prior to induction Oxygen Delivery Method: Circle system utilized Preoxygenation: Pre-oxygenation with 100% oxygen Induction Type: IV induction and Rapid sequence Ventilation: Mask ventilation without difficulty Laryngoscope Size: Mac and 3 Grade View: Grade I Tube type: Oral Tube size: 7.0 mm Number of attempts: 1 Airway Equipment and Method: Stylet Placement Confirmation: ETT inserted through vocal cords under direct vision,  positive ETCO2,  CO2 detector and breath sounds checked- equal and bilateral Secured at: 22 cm Tube secured with: Tape Dental Injury: Teeth and Oropharynx as per pre-operative assessment

## 2019-09-02 NOTE — Interval H&P Note (Signed)
History and Physical Interval Note:  09/02/2019 8:43 AM  Leah Carpenter  has presented today for surgery, with the diagnosis of diverticular stricture K56.699.  The various methods of treatment have been discussed with the patient and family. After consideration of risks, benefits and other options for treatment, the patient has consented to  Procedure(s): LAPAROSCOPIC SIGMOID COLECTOMY (N/A) as a surgical intervention.  The patient's history has been reviewed, patient examined, no change in status, stable for surgery.  I have reviewed the patient's chart and labs.  Questions were answered to the patient's satisfaction.     [place]

## 2019-09-02 NOTE — Transfer of Care (Signed)
Immediate Anesthesia Transfer of Care Note  Patient: Leah Carpenter  Procedure(s) Performed: LAPAROSCOPIC SIGMOID COLECTOMY (N/A )  Patient Location: PACU  Anesthesia Type:General  Level of Consciousness: awake and alert   Airway & Oxygen Therapy: Patient Spontanous Breathing and Patient connected to face mask oxygen  Post-op Assessment: Report given to RN and Post -op Vital signs reviewed and stable  Post vital signs: Reviewed and stable  Last Vitals:  Vitals Value Taken Time  BP 101/67 09/02/19 1508  Temp 36.8 C 09/02/19 1508  Pulse 92 09/02/19 1512  Resp 14 09/02/19 1512  SpO2 100 % 09/02/19 1512  Vitals shown include unvalidated device data.  Last Pain:  Vitals:   09/02/19 0832  TempSrc: Tympanic  PainSc: 0-No pain         Complications: No apparent anesthesia complications

## 2019-09-02 NOTE — Anesthesia Preprocedure Evaluation (Signed)
Anesthesia Evaluation  Patient identified by MRN, date of birth, ID band Patient awake    Reviewed: Allergy & Precautions, H&P , NPO status , Patient's Chart, lab work & pertinent test results, reviewed documented beta blocker date and time   Airway Mallampati: III  TM Distance: >3 FB Neck ROM: full    Dental  (+) Poor Dentition   Pulmonary neg pulmonary ROS, sleep apnea , Current Smoker and Patient abstained from smoking.,    Pulmonary exam normal        Cardiovascular Exercise Tolerance: Poor negative cardio ROS Normal cardiovascular exam Rhythm:regular Rate:Normal     Neuro/Psych Seizures -,  PSYCHIATRIC DISORDERS Depression negative neurological ROS  negative psych ROS   GI/Hepatic negative GI ROS, Neg liver ROS, GERD  Medicated,  Endo/Other  negative endocrine ROS  Renal/GU negative Renal ROS  negative genitourinary   Musculoskeletal   Abdominal   Peds  Hematology negative hematology ROS (+)   Anesthesia Other Findings Past Medical History: No date: Depression No date: GERD (gastroesophageal reflux disease) No date: Seizures ([place]) No date: Sleep apnea Past Surgical History: No date: ABDOMINAL HYSTERECTOMY; Left     Comment:  one ovary remaining 1991: BRAIN TUMOR EXCISION 2009: CERVICAL FUSION No date: CHOLECYSTECTOMY 11/06/2018: ESOPHAGOGASTRODUODENOSCOPY (EGD) WITH PROPOFOL; N/A     Comment:  Procedure: ESOPHAGOGASTRODUODENOSCOPY (EGD) WITH               PROPOFOL;  Surgeon: Robert Bellow, MD;  Location:               [facility] ENDOSCOPY;  Service: Endoscopy;  Laterality: N/A;   Reproductive/Obstetrics negative OB ROS                             Anesthesia Physical Anesthesia Plan  ASA: III  Anesthesia Plan: General   Post-op Pain Management:    Induction:   PONV Risk Score and Plan:   Airway Management Planned:   Additional Equipment:   Intra-op Plan:    Post-operative Plan:   Informed Consent: I have reviewed the patients History and Physical, chart, labs and discussed the procedure including the risks, benefits and alternatives for the proposed anesthesia with the patient or authorized representative who has indicated his/her understanding and acceptance.     Dental Advisory Given  Plan Discussed with: CRNA  Anesthesia Plan Comments:         Anesthesia Quick Evaluation

## 2019-09-02 NOTE — Op Note (Signed)
PROCEDURES: 1. Laparoscopic lysis of adhesions 2. Laparoscopic Low anterior resection with additional left colectomy required due to extension of her disease. Transverse colon to rectal stapled anastomosis. 3. Laparoscopic takedown of splenic flexure  Pre-operative Diagnosis: Diverticulitis with stricture  Post-operative Diagnosis: same  Surgeon: Merri Rayiego F Sherae Santino   Assistants: Dr. Thelma Bargeaks required due to the complexity of the case and for the anastomosis  Anesthesia: General endotracheal anesthesia  ASA Class: 2   Surgeon: Sterling Bigiego Ticara Waner , MD FACS  Anesthesia: Gen. with endotracheal tube   Findings: Long segment stricture  Involving Descending and sigmoid colon. Thick descending colon , sigmoid and proximal rectum c/w chronic diverticulitis Tension free anastomosis with good perfusion and no evidence of intraoperative leak.  Estimated Blood Loss: 50cc         Drains: 15 FR Pelvis         Specimens: Descending, sigmoid colon and proximal rectum          Complications: none                Condition: stable  Procedure Details  The patient was seen again in the Holding Room. The benefits, complications, treatment options, and expected outcomes were discussed with the patient. The risks of bleeding, infection, recurrence of symptoms, failure to resolve symptoms,  bowel injury, any of which could require further surgery were reviewed with the patient.   The patient was taken to Operating Room, identified as Leah Carpenter and the procedure verified.  A Time Out was held and the above information confirmed.  Prior to the induction of general anesthesia, antibiotic prophylaxis was administered. VTE prophylaxis was in place. General endotracheal anesthesia was then administered and tolerated well. After the induction, the abdomen was prepped with Chloraprep and draped in the sterile fashion. The patient was positioned in the supine position. Prior to the induction of general anesthesia,  antibiotic prophylaxis was administered. VTE prophylaxis was in place. General endotracheal anesthesia was then administered and tolerated well. After the induction, the abdomen was prepped with Chloraprep and draped in the sterile fashion. The patient was positioned in lithotomy position. 7 cm incision was created as a midline mini laparotomy. The abdominal cavity was entered under direct visualization and the GelPort device was placed. A 5 mm port was placed in the suprapubic area under direct visualization and pneumoperitoneum was obtained. There were dense adhesions from the omentum to the abdominal wall that where lysed in the standard fashion with the Harmonic scalpel. We also were able to place a 12 mm port in the right lower quadrant and a 5 mm port in the left lower quadrant under direct visualization. There was significant adhesive disease in the pelvis from the sigmoid to the pelvic wall and also from the sigmoid to the ovary and the uterus. This adhesions were lysed with a combination of finger fracturing and Harmonic scalpel. The white line of pot was identified and divided and we mobilized the descending colon IN a lateral to medial fashion. We preserved the ureter at all times. We were also able to mobilize the splenic flexure using Harmonic scalpel in the standard fashion. We identified the takeoff of the inferior mesenteric artery dissected the pedicle and divided using a 60 mm vascular echelon stapler in the standard fashion. Using the Harmonic's scalpel were able to divide the mesorectum and and also divided proximal to the mesentery of the descending colon. Once we have an adequate visualization and mobilization we divided the proximal rectum distally  After  mobilizing the peritoneal reflection to gain from length on the rectal stump. Multiple blue loads using the echelon stapler were used. We removed the GelPort and visualized the colon in a direct fashion. We divided the colon at the  transverse colon level due to thickened colon all the way involving the splenic flexure, we used a standard blue load Exchelon. Given the fact that we needed adequate length the IMV was divided with a vascular load. We opened the colon and measured the diameter of the bowel. A 29 mm dilator was perfect size. A pursestring was used after inserting the anvil device. Dr. Genevive Bi was able to pass a 28 mm standard EEA stapler device through the anus,  Under direct visualization we perform an end to end anastomosis with the EEA device. A leak test was performed inflating the colon with a [place] syringe and a rubber catheter. No evidence of leak was observed. There was also adequate hemostasis. A 15 Blake drain was placed in the pelvis. We were able to mobilize the omentum and I created an omental flap to attach it to the anastomosis. The drain was sutured in place with a 3-0 nylon. All the laparoscopic ports were removed and a second look showed no evidence of any bleeding or any other injuries. We changed gloves and place a new tray to close the abdomen with a -0 PDS suture in a running fashion and the skin was closed with 4-0 Monocryl. Liposomal Marcaine was injected on all incision sites under direct visualization. Dermabond was used to coat all the skin incisions. Needle and laparotomy count were correct and there were no immediate occasions  Leah Hamman, MD, FACS

## 2019-09-03 LAB — BASIC METABOLIC PANEL
Anion gap: 5 (ref 5–15)
BUN: 7 mg/dL (ref 6–20)
CO2: 26 mmol/L (ref 22–32)
Calcium: 8.7 mg/dL — ABNORMAL LOW (ref 8.9–10.3)
Chloride: 109 mmol/L (ref 98–111)
Creatinine, Ser: 0.6 mg/dL (ref 0.44–1.00)
GFR calc Af Amer: >60 mL/min (ref >60)
GFR calc non Af Amer: >60 mL/min (ref >60)
Glucose, Bld: 155 mg/dL — ABNORMAL HIGH (ref 70–99)
Potassium: 4.2 mmol/L (ref 3.5–5.1)
Sodium: 140 mmol/L (ref 135–145)

## 2019-09-03 LAB — CBC
HCT: 34.3 % — ABNORMAL LOW (ref 36.0–46.0)
Hemoglobin: 11.6 g/dL — ABNORMAL LOW (ref 12.0–15.0)
MCH: 31.1 pg (ref 26.0–34.0)
MCHC: 33.8 g/dL (ref 30.0–36.0)
MCV: 92 fL (ref 80.0–100.0)
Platelets: 210 10*3/uL (ref 150–400)
RBC: 3.73 MIL/uL — ABNORMAL LOW (ref 3.87–5.11)
RDW: 12.9 % (ref 11.5–15.5)
WBC: 13.4 10*3/uL — ABNORMAL HIGH (ref 4.0–10.5)
nRBC: 0 % (ref 0.0–0.2)

## 2019-09-03 NOTE — Progress Notes (Signed)
[facility] Day(s): 1.   Post op day(s): 1 Day Post-Op.   Interval History: Patient seen and examined, no acute events or new complaints overnight. Patient reports that she is doing well. No significant soreness or abdominal pain, she is only requiring tylenol. She does report "gas pain" in RLQ intermittently. No fever, chills, nausea, or emesis. She is tolerating CLD. No flatus. Has not mobilized yet.    Vital signs in last 24 hours: [min-max] current  Temp:  [96.6 F (35.9 C)-98.8 F (37.1 C)] 98.8 F (37.1 C) (09/09 0410) Pulse Rate:  [53-109] 59 (09/09 0410) Resp:  [10-22] 18 (09/09 0410) BP: (99-130)/(56-90) 123/75 (09/09 0410) SpO2:  [93 %-100 %] 97 % (09/09 0410) Weight:  [71.2 kg] 71.2 kg (09/08 2007)     Height: 5\' 3"  (160 cm) Weight: 71.2 kg BMI (Calculated): 27.81   Intake/Output last 2 shifts:  09/08 0701 - 09/09 0700 In: 1900 [I.V.:1900] Out: 250 [Urine:150; Drains:50; Blood:50]   Physical Exam:  Constitutional: alert, cooperative and no distress  Respiratory: breathing non-labored at rest  Cardiovascular: regular rate and sinus rhythm  Gastrointestinal: soft, soreness in suprapubic region, and non-distended. No rebound/guarding, JP in LLQ with serosanguinous output Integumentary: Mini-laparotomy and laparoscopic incisions are CDI with dermabond, no erythema or drainage  Labs:  CBC Latest Ref Rng & Units 09/03/2019 09/02/2019 08/29/2019  WBC 4.0 - 10.5 K/uL 13.4(H) 14.7(H) 7.3  Hemoglobin 12.0 - 15.0 g/dL 11.6(L) 13.0 13.3  Hematocrit 36.0 - 46.0 % 34.3(L) 39.0 39.0  Platelets 150 - 400 K/uL 210 231 260   CMP Latest Ref Rng & Units 09/03/2019 09/02/2019 06/10/2014  Glucose 70 - 99 mg/dL 155(H) - 78  BUN 6 - 20 mg/dL 7 - 4(L)  Creatinine 0.44 - 1.00 mg/dL 0.60 0.81 0.69  Sodium 135 - 145 mmol/L 140 - 139  Potassium 3.5 - 5.1 mmol/L 4.2 - 3.4(L)  Chloride 98 - 111 mmol/L 109 - 104  CO2 22 - 32 mmol/L 26 - 27  Calcium  8.9 - 10.3 mg/dL 8.7(L) - 8.2(L)  Total Protein 6.4 - 8.2 g/dL - - -  Total Bilirubin 0.2 - 1.0 mg/dL - - -  Alkaline Phos Unit/L - - -  AST 15 - 37 Unit/L - - -  ALT 12 - 78 U/L - - -    Imaging studies: No new pertinent imaging studies   Assessment/Plan: 48 y.o. female awaiting return of bowel function, question of early ileus, otherwise doing well 1 Day Post-Op s/p LAR with extended left colectomy for chronic diverticulitis with stricture   - Continue on CLD until bowel function returns + IVF   - Pain control prn (minimize narcotics)  - Monitor abdominal examination; on-going bowel function   - Discontinue foley catheter  - Medical management of comorbidities  - Mobilization encouraged; IS use  - DVT prophylaxis   All of the above findings and recommendations were discussed with the patient, and the medical team, and all of patient's questions were answered to her expressed satisfaction.  -- Edison Simon, PA-C [facility] 09/03/2019, 7:26 AM [phone] M-F: 7am - 4pm

## 2019-09-04 LAB — BASIC METABOLIC PANEL
Anion gap: 5 (ref 5–15)
BUN: 6 mg/dL (ref 6–20)
CO2: 28 mmol/L (ref 22–32)
Calcium: 8.7 mg/dL — ABNORMAL LOW (ref 8.9–10.3)
Chloride: 110 mmol/L (ref 98–111)
Creatinine, Ser: 0.73 mg/dL (ref 0.44–1.00)
GFR calc Af Amer: >60 mL/min (ref >60)
GFR calc non Af Amer: >60 mL/min (ref >60)
Glucose, Bld: 101 mg/dL — ABNORMAL HIGH (ref 70–99)
Potassium: 3.6 mmol/L (ref 3.5–5.1)
Sodium: 143 mmol/L (ref 135–145)

## 2019-09-04 LAB — HIV ANTIBODY (ROUTINE TESTING W REFLEX): HIV Screen 4th Generation wRfx: NONREACTIVE

## 2019-09-04 MED ORDER — OXYCODONE HCL 5 MG PO TABS: 5.0000 mg | ORAL_TABLET | ORAL | 0 refills | Status: DC | PRN

## 2019-09-04 NOTE — Discharge Instructions (Signed)
In addition to included general post-operative instructions for exploratory laparotomy,  Diet: GRADUALLY resume home heart healthy diet.   Activity: No heavy lifting >20 pounds (children, pets, laundry, garbage) for at least 4-6 weeks, but light activity and walking are encouraged. Do not drive or drink alcohol if taking narcotic pain medications or having pain that might distract from driving.  Wound care: You may shower/get incision wet with soapy water and pat dry (do not rub incisions), but no baths or submerging incision underwater until follow-up.   Medications: Resume all home medications. For mild to moderate pain: acetaminophen (Tylenol) or ibuprofen/naproxen (if no kidney disease). Combining Tylenol with alcohol can substantially increase your risk of causing liver disease. Narcotic pain medications, if prescribed, can be used for severe pain, though may cause nausea, constipation, and drowsiness. Do not combine Tylenol and Percocet (or similar) within a 6 hour period as Percocet (and similar) contain(s) Tylenol. If you do not need the narcotic pain medication, you do not need to fill the prescription.  Call office [phone] / [phone]) at any time if any questions, worsening pain, fevers/chills, bleeding, drainage from incision site, or other concerns.

## 2019-09-04 NOTE — Anesthesia Postprocedure Evaluation (Signed)
Anesthesia Post Note  Patient: Leah Carpenter  Procedure(s) Performed: LAPAROSCOPIC SIGMOID COLECTOMY (N/A )  Patient location during evaluation: PACU Anesthesia Type: General Level of consciousness: awake and alert Pain management: pain level controlled Vital Signs Assessment: post-procedure vital signs reviewed and stable Respiratory status: spontaneous breathing, nonlabored ventilation, respiratory function stable and patient connected to nasal cannula oxygen Cardiovascular status: blood pressure returned to baseline and stable Postop Assessment: no apparent nausea or vomiting Anesthetic complications: no     Last Vitals:  Vitals:   09/03/19 2042 09/04/19 0506  BP: 138/79 131/78  Pulse: (!) 55 (!) 50  Resp: 20 20  Temp: 37 C 37.1 C  SpO2: 100% 100%    Last Pain:  Vitals:   09/04/19 0956  TempSrc:   PainSc: 3                  Molli Barrows

## 2019-09-04 NOTE — Discharge Summary (Signed)
[facility] SURGICAL DISCHARGE SUMMARY  Patient ID: Leah Carpenter MRN: [id number] DOB/AGE: January 20, 1971 48 y.o.  Admit date: 09/02/2019 Discharge date: 09/04/2019  Discharge Diagnoses Patient Active Problem List   Diagnosis Date Noted  . Diverticulitis large intestine 09/02/2019  . Diverticulitis of colon 10/30/2018  . Generalized abdominal pain 10/30/2018  . Gastroesophageal reflux disease without esophagitis 10/30/2018    Consultants None  Procedures 09/02/2019:  1. Laparoscopic lysis of adhesions 2. Laparoscopic Low anterior resection with additional left colectomy required due to extension of her disease. Transverse colon to rectal stapled anastomosis. 3. Laparoscopic takedown of splenic flexure  HPI: Leah Carpenter is a 48 y.o. female with a history of recurrent diverticulitis and likely stricture who presents to [facility] on 09/08 for scheduled laparoscopic sigmoid colectomy with Dr Dahlia Byes.   Hospital Course: Informed consent was obtained and documented, and patient underwent uneventful laparoscopic sigmoid colectomy (Dr Dahlia Byes, 09/02/2019).  Post-operatively, patient's pain improved/resolved and advancement of patient's diet and ambulation were well-tolerated. The remainder of patient's hospital course was essentially unremarkable, and discharge planning was initiated accordingly with patient safely able to be discharged home with appropriate discharge instructions, pain control, and outpatient follow-up after all of her and family's questions were answered to their expressed satisfaction.   Discharge Condition: Good   Physical Examination:  Constitutional: Well appearing female, NAD Pulmonary: Normal effort, no respiratory distress Gastrointestinal: Soft, incisional soreness, non-distended, no rebound/guarding. JP in LLQ with serosanguinous output - will remove before DC Skin: Laparoscopic incisions are CDI with dermabond, no erythema or drainage     Allergies as of 09/04/2019   No Known Allergies     Medication List    STOP taking these medications   Erythromycin 500 MG Tbec   neomycin 500 MG tablet Commonly known as: MYCIFRADIN     TAKE these medications as needed     hyoscyamine 0.125 MG/5ML Elix Commonly known as: LEVSIN Take 0.125 mg by mouth.   hyoscyamine 0.125 MG Tbdp disintergrating tablet Commonly known as: ANASPAZ Place 0.125 mg under the tongue every 4 (four) hours as needed (cramping/diarrhea).   ibuprofen 200 MG tablet Commonly known as: ADVIL Take 800 mg by mouth every 6 (six) hours as needed (for pain.).   omeprazole 40 MG capsule Commonly known as: PRILOSEC Take 40 mg by mouth daily.   oxyCODONE 5 MG immediate release tablet Commonly known as: Oxy IR/ROXICODONE Take 1-2 tablets (5-10 mg total) by mouth every 4 (four) hours as needed for moderate pain.           Follow-up Information    Pabon, [place] F, MD Follow up in 2 week(s).   Specialty: General Surgery Why: Follow up in 2 weeks, okay to see Thedore Mins PA Contact information: [address] [place] [address] [phone]            Time spent on discharge management including discussion of hospital course, clinical condition, outpatient instructions, prescriptions, and follow up with the patient and members of the medical team: >30 minutes  -- Edison Simon , PA-C [facility]  09/04/2019, 11:19 AM [phone] M-F: 7am - 4pm

## 2019-09-04 NOTE — Progress Notes (Signed)
Discharge instructions reviewed with the patient. Patient sent out via wheelchair with belongings and instructions

## 2019-09-05 LAB — SURGICAL PATHOLOGY

## 2019-09-18 ENCOUNTER — Encounter: Payer: Self-pay | Admitting: Physician Assistant

## 2019-09-18 ENCOUNTER — Other Ambulatory Visit: Payer: Self-pay

## 2019-09-18 ENCOUNTER — Ambulatory Visit (INDEPENDENT_AMBULATORY_CARE_PROVIDER_SITE_OTHER): Payer: Federal, State, Local not specified - PPO | Admitting: Physician Assistant

## 2019-09-18 VITALS — BP 136/83 | HR 76 | Temp 97.5°F | Ht 63.0 in | Wt 153.0 lb

## 2019-09-18 DIAGNOSIS — K5720 Diverticulitis of large intestine with perforation and abscess without bleeding: Secondary | ICD-10-CM

## 2019-09-18 DIAGNOSIS — Z09 Encounter for follow-up examination after completed treatment for conditions other than malignant neoplasm: Secondary | ICD-10-CM

## 2019-09-18 NOTE — Patient Instructions (Addendum)
Please call with any questions or concerns.  GENERAL POST-OPERATIVE PATIENT INSTRUCTIONS   WOUND CARE INSTRUCTIONS:  Keep a dry clean dressing on the wound if there is drainage. The initial bandage may be removed after 24 hours.  Once the wound has quit draining you may leave it open to air.  If clothing rubs against the wound or causes irritation and the wound is not draining you may cover it with a dry dressing during the daytime.  Try to keep the wound dry and avoid ointments on the wound unless directed to do so.  If the wound becomes bright red and painful or starts to drain infected material that is not clear, please contact your physician immediately.  If the wound is mildly pink and has a thick firm ridge underneath it, this is normal, and is referred to as a healing ridge.  This will resolve over the next 4-6 weeks.  BATHING: You may shower if you have been informed of this by your surgeon. However, Please do not submerge in a tub, hot tub, or pool until incisions are completely sealed or have been told by your surgeon that you may do so.  DIET:  You may eat any foods that you can tolerate.  It is a good idea to eat a high fiber diet and take in plenty of fluids to prevent constipation.  If you do become constipated you may want to take a mild laxative or take ducolax tablets on a daily basis until your bowel habits are regular.  Constipation can be very uncomfortable, along with straining, after recent surgery.  ACTIVITY:  You are encouraged to cough and deep breath or use your incentive spirometer if you were given one, every 15-30 minutes when awake.  This will help prevent respiratory complications and low grade fevers post-operatively if you had a general anesthetic.  You may want to hug a pillow when coughing and sneezing to add additional support to the surgical area, if you had abdominal or chest surgery, which will decrease pain during these times.  You are encouraged to walk and engage  in light activity for the next two weeks.  You should not lift more than 20 pounds, until 10/14/2019 as it could put you at increased risk for complications.  Twenty pounds is roughly equivalent to a plastic bag of groceries. At that time- Listen to your body when lifting, if you have pain when lifting, stop and then try again in a few days. Soreness after doing exercises or activities of daily living is normal as you get back in to your normal routine.  MEDICATIONS:  Try to take narcotic medications and anti-inflammatory medications, such as tylenol, ibuprofen, naprosyn, etc., with food.  This will minimize stomach upset from the medication.  Should you develop nausea and vomiting from the pain medication, or develop a rash, please discontinue the medication and contact your physician.  You should not drive, make important decisions, or operate machinery when taking narcotic pain medication.  SUNBLOCK Use sun block to incision area over the next year if this area will be exposed to sun. This helps decrease scarring and will allow you avoid a permanent darkened area over your incision.  QUESTIONS:  Please feel free to call our office if you have any questions, and we will be glad to assist you. [phone]

## 2019-09-18 NOTE — Progress Notes (Signed)
[facility] POST-OP OFFICE VISIT  09/18/2019  HPI: MODELL FENDRICK is a 48 y.o. female 16 days s/p laparoscopic LAR for sigmoid diverticulitis with stricture.   Overall, she reports that she is recovering very well. She had some issues with back pain after the case but this is slowly improving. No complaints of fever, chills, nausea, or emesis. She is tolerating a diet without issue. No bowel changes. No other acute concerns.   Vital signs: BP 136/83   Pulse 76   Temp (!) 97.5 F (36.4 C) (Skin)   Ht 5\' 3"  (1.6 m)   Wt 153 lb (69.4 kg)   SpO2 98%   BMI 27.10 kg/m    Physical Exam: Constitutional: Well appearing female, NAD Abdomen: Soft, non-tender, non-distended, no rebound/guarding Skin: Laparoscopic and laparotomy incisions are Well healed, no erythema or drainage  Assessment/Plan: This is a 48 y.o. female 16 days s/p laparoscopic LAR for diverticulitis   - Pain control prn  - okay to submerge wounds  - light activity okay  - complete 6 weeks lifting restrictions  - reviewed pathology  - rtc in 2-3 weeks for final re-check  -- Edison Simon, PA-C [facility] 09/18/2019, 9:01 AM [phone] M-F: 7am - 4pm'

## 2019-10-02 ENCOUNTER — Encounter: Payer: Self-pay | Admitting: Physician Assistant

## 2019-10-16 ENCOUNTER — Ambulatory Visit (INDEPENDENT_AMBULATORY_CARE_PROVIDER_SITE_OTHER): Payer: Self-pay | Admitting: Physician Assistant

## 2019-10-16 ENCOUNTER — Encounter: Payer: Self-pay | Admitting: Physician Assistant

## 2019-10-16 ENCOUNTER — Other Ambulatory Visit: Payer: Self-pay

## 2019-10-16 VITALS — BP 127/82 | HR 85 | Temp 97.2°F | Ht 63.0 in | Wt 155.6 lb

## 2019-10-16 DIAGNOSIS — Z09 Encounter for follow-up examination after completed treatment for conditions other than malignant neoplasm: Secondary | ICD-10-CM

## 2019-10-16 DIAGNOSIS — K5720 Diverticulitis of large intestine with perforation and abscess without bleeding: Secondary | ICD-10-CM

## 2019-10-16 NOTE — Patient Instructions (Signed)
Patient may gradually increase back to normal activities. Patient may try over-the-counter medication(s) for gas relief. If you have any questions or concerns, please feel free to contact our office.   Flexible Sigmoidoscopy, Care After This sheet gives you information about how to care for yourself after your procedure. Your health care provider may also give you more specific instructions. If you have problems or questions, contact your health care provider. What can I expect after the procedure? After the procedure, it is common to have:  Abdominal cramping or pain.  Bloating.  A small amount of rectal bleeding if you had a biopsy. Follow these instructions at home:  Take over-the-counter and prescription medicines only as told by your health care provider.  Do not drive for 24 hours if you received a medicine to help you relax (sedative).  Keep all follow-up visits as told by your health care provider. This is important. Contact a health care provider if:  You have abdominal pain or cramping that gets worse or is not helped with medicine.  You continue to have small amounts of rectal bleeding after 24 hours.  You have nausea or vomiting.  You feel weak or dizzy.  You have a fever. Get help right away if:  You pass large blood clots or see a large amount of blood in the toilet after having a bowel movement.  You have nausea or vomiting for more than 24 hours after the procedure. This information is not intended to replace advice given to you by your health care provider. Make sure you discuss any questions you have with your health care provider. Document Released: 12/16/2013 Document Revised: 08/03/2016 Document Reviewed: 03/11/2016 Elsevier Patient Education  2020 [employer].

## 2019-10-16 NOTE — Progress Notes (Signed)
[facility] POST-OP OFFICE VISIT  10/16/2019  HPI: Leah Carpenter is a 48 y.o. female 6 weeks s/p laparoscopic LAR for sigmoid diverticulitis with stricture  She reports she is overall doing well. She notes a "pulling" sensation intermittent arising from her midline incision. Otherwise no pain. No fever, chills, nausea, or emesis. She does not urgency with stools but can always make it to the bathroom. This is not overly bothersome. No incontinence or diarrhea.   Vital signs: BP 127/82   Pulse 85   Temp (!) 97.2 F (36.2 C) (Temporal)   Ht 5\' 3"  (1.6 m)   Wt 155 lb 9.6 oz (70.6 kg)   SpO2 96%   BMI 27.56 kg/m    Physical Exam: Constitutional: Well appearing female, NAD Abdomen: Soft, non-tender, non-distended, no rebound/guarding Skin: Laparotomy and laparoscopic incisions are well healed  Assessment/Plan: This is a 48 y.o. female  6 weeks s/p laparoscopic LAR for sigmoid diverticulitis with stricture   - pain control prn  - she was offered Flexeril as I suspect the pulling sensation is related to the fascial closure and muscle healing; however she declined  - okay to submerge wounds  - No lifting restrictions; return to regular activity  - monitor bowel urgency  - rtc prn; advised to call with question/concerns  -- Edison Simon, PA-C [facility] 10/16/2019, 9:08 AM [phone] M-F: 7am - 4pm
